# Patient Record
Sex: Female | Born: 1985 | Race: White | Hispanic: Yes | Marital: Married | Smoking: Never smoker
Health system: Southern US, Community
[De-identification: ages and names within clinical notes are randomized; demographics above are authoritative.]

## PROBLEM LIST (undated history)

## (undated) DIAGNOSIS — O24419 Gestational diabetes mellitus in pregnancy, unspecified control: Secondary | ICD-10-CM

## (undated) DIAGNOSIS — Z789 Other specified health status: Secondary | ICD-10-CM

## (undated) DIAGNOSIS — Z803 Family history of malignant neoplasm of breast: Secondary | ICD-10-CM

## (undated) DIAGNOSIS — Z8481 Family history of carrier of genetic disease: Secondary | ICD-10-CM

## (undated) DIAGNOSIS — Z86711 Personal history of pulmonary embolism: Secondary | ICD-10-CM

## (undated) HISTORY — DX: Personal history of pulmonary embolism: Z86.711

## (undated) HISTORY — PX: NO PAST SURGERIES: SHX2092

## (undated) HISTORY — DX: Family history of malignant neoplasm of breast: Z80.3

## (undated) HISTORY — DX: Family history of carrier of genetic disease: Z84.81

---
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
## 2007-06-29 ENCOUNTER — Other Ambulatory Visit: Admission: RE | Admit: 2007-06-29 | Discharge: 2007-06-29 | Payer: Self-pay | Admitting: Obstetrics and Gynecology

## 2007-12-08 ENCOUNTER — Inpatient Hospital Stay (HOSPITAL_COMMUNITY): Admission: AD | Admit: 2007-12-08 | Discharge: 2007-12-08 | Payer: Self-pay | Admitting: Obstetrics and Gynecology

## 2008-10-31 ENCOUNTER — Emergency Department (HOSPITAL_COMMUNITY): Admission: EM | Admit: 2008-10-31 | Discharge: 2008-10-31 | Payer: Self-pay | Admitting: Emergency Medicine

## 2008-12-27 ENCOUNTER — Emergency Department (HOSPITAL_COMMUNITY): Admission: EM | Admit: 2008-12-27 | Discharge: 2008-12-27 | Payer: Self-pay | Admitting: Emergency Medicine

## 2010-10-19 ENCOUNTER — Encounter: Payer: Self-pay | Admitting: *Deleted

## 2011-01-07 LAB — COMPREHENSIVE METABOLIC PANEL
Alkaline Phosphatase: 77 U/L (ref 39–117)
CO2: 25 mEq/L (ref 19–32)
Calcium: 9.8 mg/dL (ref 8.4–10.5)
Chloride: 105 mEq/L (ref 96–112)
Creatinine, Ser: 0.65 mg/dL (ref 0.4–1.2)
GFR calc Af Amer: >60 mL/min (ref >60)
Potassium: 3.6 mEq/L (ref 3.5–5.1)
Sodium: 138 mEq/L (ref 135–145)

## 2011-01-07 LAB — CBC
HCT: 40.2 % (ref 36.0–46.0)
Hemoglobin: 13.9 g/dL (ref 12.0–15.0)
RBC: 4.45 MIL/uL (ref 3.87–5.11)
RDW: 12.9 % (ref 11.5–15.5)

## 2011-01-07 LAB — DIFFERENTIAL
Basophils Relative: 0 % (ref 0–1)
Eosinophils Absolute: 0 10*3/uL (ref 0.0–0.7)
Lymphocytes Relative: 27 % (ref 12–46)
Monocytes Absolute: 0.5 10*3/uL (ref 0.1–1.0)
Neutro Abs: 5.9 10*3/uL (ref 1.7–7.7)

## 2011-01-07 LAB — URINALYSIS, ROUTINE W REFLEX MICROSCOPIC
Bilirubin Urine: NEGATIVE
Ketones, ur: NEGATIVE mg/dL
pH: 7 (ref 5.0–8.0)

## 2011-02-13 NOTE — H&P (Signed)
[name], SANSON                   ACCOUNT NO.:  [id number]   MEDICAL RECORD NO.:  [id number]           PATIENT TYPE:   LOCATION:                                 FACILITY:   PHYSICIAN:  Langley Gauss, MD          DATE OF BIRTH:   DATE OF ADMISSION:  07/23/2004  DATE OF DISCHARGE:  LH                                HISTORY & PHYSICAL   HISTORY:  A 25 year old gravida 1, para 0, at 88 weeks' gestation admitted  for induction of labor secondary to psychosocial factors.  The patient is  dependent upon others for transportation purposes.  Today she comes in with  her sister, Eloise __________. The patient has had irregular uterine  contractions over the weekend as well as increased mucous discharge.  She  has had cervical change over the prior week from 2-3 cm dilatation. The  patient's prenatal course had been uncomplicated.  GTT was normal at 118.  She has had serial ultrasounds which have documented adequate fetal growth  and normal anatomic survey.  She has experienced a 39 pound weight gain.  GBS noted to be negative.  A triple screen is normal.  She is noted to be  experiencing an abnormal Pap which did reveal the presence of a CUS.  No  biopsy was done during the pregnancy.   ALLERGIES:  She has no known drug allergies.   PAST MEDICAL HISTORY:  No other medical or surgical history.  This is her  first pregnancy.   SOCIAL HISTORY:  The patient previously attended [employer].  She is a nonsmoker. She does plan on utilizing condoms for postpartum birth  control purposes.  She plans on both bottle and breast feeding.   PHYSICAL EXAMINATION:  GENERAL:  A pleasant female who speaks English very  well.  VITAL SIGNS:  Height 5 feet 2 inches.  Weight prepregnancy 144 pounds,  today's weight 183.  Blood pressure 117/73, pulse rate of 80, respiratory  rate is 20.  HEENT:  Negative.  NECK:  No adenopathy.  Neck is supple.  Thyroid is nonpalpable.  LUNGS:  Clear.  CARDIOVASCULAR:  Regular rate and rhythm.  ABDOMEN:  Soft and nontender.  No surgical scars are identified. She is  vertex presentation by Leopold's maneuver.  EXTREMITIES:  Noted to be normal, 1+ edema.  PELVIC EXAM:  Normal external genitalia.  No lesions or ulcerations are  identified.  Pelvis is noted to be clinically adequate on examination.  Cervix 3 cm dilated, 70% effaced, vertex well applied at a 0 station.  Fetal  heart tones are auscultated in the 150s.  By patient history experiencing  irregular uterine contractions on today's date.   ASSESSMENT AND PLAN:  A 39-week intrauterine pregnancy in early stages of  labor.  Has had cervical change from 2-3 cm from the prior week.  Cervix is  very favorable at this time.  Plan on referring the patient to [facility]. Will proceed with amniotomy; thereafter augment or induce with  Pitocin as clinically indicated.  Dona   DC/MEDQ  D:  07/23/2004  T:  07/23/2004  Job:  [id number]   cc:   [facility]

## 2011-02-13 NOTE — Op Note (Signed)
NAMECORYNN, [name]                  ACCOUNT NO.:  [id number]   MEDICAL RECORD NO.:  [id number]          PATIENT TYPE:  INP   LOCATION:  A413                          FACILITY:  [facility]   PHYSICIAN:  Langley Gauss, MD     DATE OF BIRTH:  31-Oct-1985   DATE OF PROCEDURE:  07/24/2004  DATE OF DISCHARGE:                                  PROCEDURE NOTE   DELIVERY NOTE:   PROCEDURE:  1.  Thirty-nine-week intrauterine pregnancy, in early labor, delivery      performed, spontaneous assisted vaginal delivery of 7-pound 4-ounce      female infant.  2.  Midline episiotomy and repair.   ATTENDING:  Langley Gauss, M.D.   ANALGESIA:  The patient received IV Nubain during the course of labor; she  declined placement of an epidural.  At time of delivery, a total of 30 mL of  1% lidocaine plain were injected in the midline of the perineal body.   ESTIMATED BLOOD LOSS:  Less than  500 mL.   SPECIMENS:  Arterial cord gas and cord blood to pathology laboratory.  Placenta was noted and noted to be apparently intact with a 3-vessel  umbilical cord.   FINDINGS:  Findings at time of delivery include a nuchal cord x2 without  compression.   SUMMARY:  The patient was seen in the office, dated July 23, 2004, noted  to be in early labor with cervical change from 2- to 3-cm dilated, referred  to [facility] for direct admission.  GBS status is negative.  The  patient was noted to be contracting every 3-5 minutes.  Fetal scalp  electrode was placed with resultant amniotomy, findings of clear amniotic  fluid.  Subsequently, the patient did require Pitocin augmentation; she  declined placement of an epidural.  She progressed normally along the labor  curve to complete dilatation, had a strong urge to push, placed in the  dorsal lithotomy position, prepped and draped in the usual sterile manner,  30 mL of 1% lidocaine plain injected in the midline of the perineal body,  small midline episiotomy is  performed, infant delivered in a direct OA  position over this midline episiotomy without extension, mouth and nares  both suctioned of clear amniotic fluid.  A nuchal cord x2 was reduced.  Spontaneous rotation occurred to a right anterior shoulder position.  Gentle  downward traction combined with expulsive efforts resulted in delivery of  this anterior shoulder as well as the remainder of the infant without  difficulty.  Umbilical cord is then milked towards the infant, cord is  doubly clamped and cut, and infant is placed on the maternal abdomen for  immediate bonding purposes.  Arterial cord gas and cord blood are then  obtained.  Gentle traction on the umbilical cord results in separation,  which upon examination is noted to be an intact placenta with associated 3-  vessel umbilical cord.  Excellent uterine tone is achieved following  delivery.  Examination reveals no lacerations.  Midline episiotomy is  repaired utilizing interrupted 0 Vicryl sutures on the superficial  transverse perineal muscle, followed by interrupted 2-0 Vicryl suture on the  soft tissue layer beneath the subcutaneous fat.  The episiotomy is then  repaired utilizing 0 chromic in a running-locked fashion on the vaginal  mucosa, followed by a 2-layer closure of 0 chromic on the perineal body.  The patient tolerated the delivery very well.  She does plan on bottle-  feeding.  She is taken out of the dorsal lithotomy position and allowed to  bond with the infant.     Vira Blanco   DC/MEDQ  D:  07/24/2004  T:  07/24/2004  Job:  [id number]

## 2011-02-13 NOTE — Op Note (Signed)
NAMEZAKAIYA, LARES                  ACCOUNT NO.:  [id number]   MEDICAL RECORD NO.:  [id number]          PATIENT TYPE:  INP   LOCATION:  LDR2                          FACILITY:  [facility]   PHYSICIAN:  Langley Gauss, MD     DATE OF BIRTH:  11-Jan-1986   DATE OF PROCEDURE:  DATE OF DISCHARGE:                                 OPERATIVE REPORT   Patient is admitted in the early stages of labor.  External fetal monitor  reveals reassuring fetal heart rate.  She is noted to have mild uterine  contractions every 3-5 minutes.   On examination, she is noted to be 3 cm dilated, 80% effaced, 0 station.  Vertex is well applied to the cervix.  Fetal scalp electrode is placed with  resultant amniotomy.  Chorioamnionic fluid is noted.   ASSESSMENT/PLAN:  Will proceed with Pitocin augmentation at this time to  increase the frequency and intensity of uterine contractions.  Patient  subsequently does desire placement of epidural for labor analgesia.     Vira Blanco   DC/MEDQ  D:  07/23/2004  T:  07/23/2004  Job:  [id number]

## 2011-02-13 NOTE — Discharge Summary (Signed)
NAMESUNI, JARNAGIN                  ACCOUNT NO.:  [id number]   MEDICAL RECORD NO.:  [id number]          PATIENT TYPE:  INP   LOCATION:  A413                          FACILITY:  [facility]   PHYSICIAN:  Langley Gauss, MD     DATE OF BIRTH:  November 15, 1985   DATE OF ADMISSION:  07/23/2004  DATE OF DISCHARGE:  10/29/2005LH                                 DISCHARGE SUMMARY   PROCEDURES:  Vaginal delivery on July 24, 2004.   DISPOSITION:  The patient is breast-feeding at the time of discharge.  Follow up in the office in four weeks' time.  She is given a copy of  standard discharge instructions.   DISCHARGE MEDICATIONS:  Tylox without refills for pain relief.   LABORATORY DATA:  Admission hemoglobin and hematocrit 11.4 and 34.5, white  count of 8.1.  On postpartum day #1, hemoglobin was 10.3 and hematocrit of  31.1, with a white count of 13.5.   HOSPITAL COURSE:  See previous dictations.  The patient delivered without  complications on July 24, 2004.  Viable vigorous female infant.  Postpartum did well.  Bonding well with the infant.  Had no complaints  following delivery.  Had no postpartum complications.  Was discharged home  on postpartum day #1-1/2.     Vira Blanco   DC/MEDQ  D:  07/29/2004  T:  07/29/2004  Job:  [id number]

## 2013-01-05 ENCOUNTER — Encounter (HOSPITAL_COMMUNITY): Payer: Self-pay

## 2013-01-05 ENCOUNTER — Emergency Department (HOSPITAL_COMMUNITY)
Admission: EM | Admit: 2013-01-05 | Discharge: 2013-01-05 | Disposition: A | Discharge status: Home / Self Care | Payer: Self-pay | Attending: Emergency Medicine | Admitting: Emergency Medicine

## 2013-01-05 DIAGNOSIS — B349 Viral infection, unspecified: Secondary | ICD-10-CM

## 2013-01-05 DIAGNOSIS — R51 Headache: Secondary | ICD-10-CM | POA: Insufficient documentation

## 2013-01-05 DIAGNOSIS — M545 Low back pain, unspecified: Secondary | ICD-10-CM | POA: Insufficient documentation

## 2013-01-05 DIAGNOSIS — R509 Fever, unspecified: Secondary | ICD-10-CM | POA: Insufficient documentation

## 2013-01-05 DIAGNOSIS — B9789 Other viral agents as the cause of diseases classified elsewhere: Secondary | ICD-10-CM | POA: Insufficient documentation

## 2013-01-05 DIAGNOSIS — R61 Generalized hyperhidrosis: Secondary | ICD-10-CM | POA: Insufficient documentation

## 2013-01-05 DIAGNOSIS — R110 Nausea: Secondary | ICD-10-CM | POA: Insufficient documentation

## 2013-01-05 LAB — URINE MICROSCOPIC-ADD ON

## 2013-01-05 LAB — PREGNANCY, URINE: Preg Test, Ur: NEGATIVE

## 2013-01-05 LAB — URINALYSIS, ROUTINE W REFLEX MICROSCOPIC
Ketones, ur: NEGATIVE mg/dL
Leukocytes, UA: NEGATIVE
Specific Gravity, Urine: 1.025 (ref 1.005–1.030)

## 2013-01-05 LAB — RAPID STREP SCREEN (MED CTR MEBANE ONLY): Streptococcus, Group A Screen (Direct): NEGATIVE

## 2013-01-05 MED ORDER — SODIUM CHLORIDE 0.9 % IV SOLN
1000.0000 mL | INTRAVENOUS | Status: DC
  Administered 2013-01-05 (×2): 1000 mL via INTRAVENOUS

## 2013-01-05 MED ORDER — DEXAMETHASONE SODIUM PHOSPHATE 10 MG/ML IJ SOLN
10.0000 mg | Freq: Once | INTRAMUSCULAR | Status: AC
  Administered 2013-01-05: 10 mg via INTRAVENOUS
  Filled 2013-01-05: qty 1

## 2013-01-05 MED ORDER — METOCLOPRAMIDE HCL 10 MG PO TABS: 10.0000 mg | ORAL_TABLET | Freq: Four times a day (QID) | ORAL | Status: DC | PRN

## 2013-01-05 MED ORDER — SODIUM CHLORIDE 0.9 % IV SOLN
1000.0000 mL | Freq: Once | INTRAVENOUS | Status: AC
  Administered 2013-01-05: 1000 mL via INTRAVENOUS

## 2013-01-05 MED ORDER — DIPHENHYDRAMINE HCL 50 MG/ML IJ SOLN
25.0000 mg | Freq: Once | INTRAMUSCULAR | Status: AC
  Administered 2013-01-05: 25 mg via INTRAVENOUS
  Filled 2013-01-05: qty 1

## 2013-01-05 MED ORDER — KETOROLAC TROMETHAMINE 30 MG/ML IJ SOLN
30.0000 mg | Freq: Once | INTRAMUSCULAR | Status: AC
  Administered 2013-01-05: 30 mg via INTRAVENOUS
  Filled 2013-01-05: qty 1

## 2013-01-05 MED ORDER — METOCLOPRAMIDE HCL 5 MG/ML IJ SOLN
10.0000 mg | Freq: Once | INTRAMUSCULAR | Status: AC
  Administered 2013-01-05: 10 mg via INTRAVENOUS
  Filled 2013-01-05: qty 2

## 2013-01-05 NOTE — ED Provider Notes (Signed)
History    This chart was scribed for Madison Booze, MD by Sofie Rower, ED Scribe. The patient was seen in room APA08/APA08 and the patient's care was started at 1:16PM.    CSN: [id number]  Arrival date & time 01/05/13  1158   First MD Initiated Contact with Patient 01/05/13 1316      Chief Complaint  Patient presents with  . Back Pain  . Fever  . Chills    (Consider location/radiation/quality/duration/timing/severity/associated sxs/prior treatment) The history is provided by the patient. No language interpreter was used.    KRISTENA WILHELMI is a 27 y.o. female , with no known medical hx, who presents to the Emergency Department complaining of sudden, progressively improving, back pain located at the thoracic and lumbar spine, radiating downwards towards the bilateral lower extremities, onset yesterday (01/04/13).  Associated symptoms include nausea. The pt reports she is experiencing a throbbing back pain, as if she may be in labor. Furthermore, the pt rates her back pain at a 10/10 at worst and 4/10 at present. The pt has has taken ibuprofen which provides moderate relief of the back pain. Additionally, the pt presents to the ED complaining of sudden, progressively worsening, non radiating headache, located at the occipital region of the head, onset yesterday (01/04/09). Associated symptoms include fever (subjective), chills, sweats, and sinus pressure. The pt reports she is experiencing a throbbing, painful sensation, located at the occipital region of her head, intensified by exposure to loud sounds.  Importantly, the pt reveals she has experienced sick contacts (her mother with similar symptoms).   The pt does not smoke or drink alcohol.   Pt does not have a PCP.     History reviewed. No pertinent past medical history.  History reviewed. No pertinent past surgical history.  No family history on file.  History  Substance Use Topics  . Smoking status: Never Smoker   . Smokeless  tobacco: Not on file  . Alcohol Use: No    OB History   Grav Para Term Preterm Abortions TAB SAB Ect Mult Living                  Review of Systems  Constitutional: Positive for fever, chills and diaphoresis.  Gastrointestinal: Positive for nausea. Negative for vomiting, diarrhea and constipation.  Genitourinary: Negative for dysuria, frequency, vaginal bleeding, vaginal discharge and difficulty urinating.  Musculoskeletal: Positive for back pain.  Neurological: Positive for headaches.  All other systems reviewed and are negative.    Allergies  Review of patient's allergies indicates no known allergies.  Home Medications  No current outpatient prescriptions on file.  BP 106/56  Pulse 96  Temp(Src) 98.4 F (36.9 C) (Oral)  Resp 20  Ht 5\' 3"  (1.6 m)  Wt 175 lb (79.379 kg)  BMI 31.01 kg/m2  SpO2 99%  Physical Exam  Nursing note and vitals reviewed. Constitutional: She is oriented to person, place, and time. She appears well-developed and well-nourished. No distress.  HENT:  Head: Normocephalic and atraumatic.  Mouth/Throat: Oropharynx is clear and moist.  Eyes: Conjunctivae and EOM are normal. Pupils are equal, round, and reactive to light.  Fundi normal.   Neck: Neck supple. No tracheal deviation present.  Cardiovascular: Normal rate, regular rhythm and normal heart sounds.  Exam reveals no gallop and no friction rub.   No murmur heard. Pulmonary/Chest: Effort normal and breath sounds normal. No respiratory distress. She has no wheezes.  Abdominal: Soft. She exhibits no distension. There is no tenderness.  Musculoskeletal: Normal range of motion. She exhibits no edema.  Negative straight leg raise.   Neurological: She is alert and oriented to person, place, and time. No cranial nerve deficit or sensory deficit.  Skin: Skin is warm and dry.  Psychiatric: She has a normal mood and affect. Her behavior is normal.    ED Course  Procedures (including critical care  time)  DIAGNOSTIC STUDIES: Oxygen Saturation is 99% on room air, normal by my interpretation.    COORDINATION OF CARE:   1:36 PM- Treatment plan discussed with patient. Pt agrees with treatment.  3:22 PM- Recheck. Pt is feeling better at this time. Treatment plan discussed with patient. Pt agrees with treatment.        Results for orders placed during the hospital encounter of 01/05/13  URINALYSIS, ROUTINE W REFLEX MICROSCOPIC      Result Value Range   Color, Urine YELLOW  YELLOW   APPearance CLEAR  CLEAR   Specific Gravity, Urine 1.025  1.005 - 1.030   pH 7.0  5.0 - 8.0   Glucose, UA NEGATIVE  NEGATIVE mg/dL   Hgb urine dipstick MODERATE (*) NEGATIVE   Bilirubin Urine NEGATIVE  NEGATIVE   Ketones, ur NEGATIVE  NEGATIVE mg/dL   Protein, ur TRACE (*) NEGATIVE mg/dL   Urobilinogen, UA 0.2  0.0 - 1.0 mg/dL   Nitrite NEGATIVE  NEGATIVE   Leukocytes, UA NEGATIVE  NEGATIVE  PREGNANCY, URINE      Result Value Range   Preg Test, Ur NEGATIVE  NEGATIVE  URINE MICROSCOPIC-ADD ON      Result Value Range   Squamous Epithelial / LPF MANY (*) RARE   WBC, UA 0-2  <3 WBC/hpf   RBC / HPF 0-2  <3 RBC/hpf   Bacteria, UA MANY (*) RARE      1. Viral syndrome   2. Headache   3. Low back pain       MDM  Symptom complex most compatible with viral syndrome. Headache may be part of that it may be a migraine. She'll be given IV fluids, IV metoclopramide, IV diphenhydramine, IV ketorolac, and IV dexamethasone. Urinalysis does have many bacteria but also as many epithelial cells and she does not have symptoms suggestive of urinary tract infection. It is felt that her bacteria is related to contaminated specimen and therefore she has not started on antibiotics.  She feels significantly better after the above noted treatment. She is advised to use over-the-counter NSAIDs and is given a prescription for metoclopramide.      I personally performed the services described in this  documentation, which was scribed in my presence. The recorded information has been reviewed and is accurate.   Madison Booze, MD 01/05/13 [id number]

## 2013-01-05 NOTE — ED Notes (Signed)
Pt presents with bilateral flank pain and mid back pain x 2 days. Pt reports started as nausea, chills and "feeling hot". Has history of kidney stones. Denies urinary dysuria,hematuria and frequency, emesis and diarrhea. Pt reports one sexual partner with no vaginal discomfort or discharge. NAD noted at this time.

## 2013-01-05 NOTE — ED Notes (Signed)
Pt reports fever, chills and low back pain that started yesterday, denies any painful urination or vaginal discharge.  +nausea yesterday.

## 2014-01-11 ENCOUNTER — Ambulatory Visit (INDEPENDENT_AMBULATORY_CARE_PROVIDER_SITE_OTHER): Payer: 59 | Admitting: Obstetrics and Gynecology

## 2014-01-11 ENCOUNTER — Encounter: Payer: Self-pay | Admitting: Obstetrics and Gynecology

## 2014-01-11 VITALS — BP 108/68 | Ht 63.0 in | Wt 184.4 lb

## 2014-01-11 DIAGNOSIS — Z309 Encounter for contraceptive management, unspecified: Secondary | ICD-10-CM | POA: Insufficient documentation

## 2014-01-11 NOTE — Progress Notes (Signed)
This chart was scribed by Leone PayorSonum Patel, Medical Scribe, for Dr. Christin BachJohn Binyamin Nelis on 01/11/14 at 3:22 PM. This chart was reviewed by Dr. Christin BachJohn Temekia Caskey and is accurate.    Patient given informed consent for removal of her Implanon, time out was performed.  Signed copy in the chart.  Appropriate time out taken. Unable to confirm location of the implant. Patient will be rescheduled to another date so that procedure can be done under ultrasound guidance.    IMP unable to find implanon. P u/s guided removal soon

## 2014-01-12 ENCOUNTER — Telehealth: Payer: Self-pay | Admitting: Obstetrics and Gynecology

## 2014-01-16 NOTE — Telephone Encounter (Signed)
Pt has an US scheduled at our office. JSY

## 2014-01-17 ENCOUNTER — Other Ambulatory Visit: Payer: 59

## 2014-01-17 ENCOUNTER — Ambulatory Visit (INDEPENDENT_AMBULATORY_CARE_PROVIDER_SITE_OTHER): Payer: 59 | Admitting: Obstetrics and Gynecology

## 2014-01-17 ENCOUNTER — Ambulatory Visit (HOSPITAL_COMMUNITY)
Admission: RE | Admit: 2014-01-17 | Discharge: 2014-01-17 | Disposition: A | Discharge status: Home / Self Care | Payer: 59 | Source: Ambulatory Visit | Attending: Obstetrics and Gynecology | Admitting: Obstetrics and Gynecology

## 2014-01-17 DIAGNOSIS — Z3049 Encounter for surveillance of other contraceptives: Secondary | ICD-10-CM

## 2014-01-17 DIAGNOSIS — Z975 Presence of (intrauterine) contraceptive device: Secondary | ICD-10-CM

## 2014-01-17 DIAGNOSIS — Z309 Encounter for contraceptive management, unspecified: Secondary | ICD-10-CM

## 2014-01-17 NOTE — Progress Notes (Addendum)
This chart was scribed by Bennett Scrapehristina Taylor, Medical Scribe, for Dr. Christin BachJohn Leeba Barbe on 01/17/14 at 3:00 PM. This chart was reviewed by Dr. Christin BachJohn Amarion Portell and is accurate.   [facility] Visit  Patient name: [name] MRN [id number]  Date of birth: 02/21/1986  CC & HPI:  Madison EstelleSara L Mulroy is a 28 y.o. female presenting today for nexaplanon removal. Has plans for birth control patch but wants to wait to get family planning first due to lack of insurance currently.   ROS:  No complaints  Pertinent History Reviewed:  Medical & Surgical Hx:  Reviewed: Significant for no chronic medical conditions Medications: Reviewed & Updated - see associated section Social History: Reviewed -  reports that she has never smoked. She has never used smokeless tobacco.  Objective Findings:  Vitals: There were no vitals taken for this visit.  Physical Examination: attempted to palpate Nexplanon but was unsuccessful. U/S was unable to identify.   Assessment & Plan:  A: 1. Nexaplanon in right arm, unable to identify by palpation or U/S  P: 1. DG right humerus ordered 2. Will review xray report by phone 3. F/u PRN for family planning  01/27/14, the implant is an IMPLANON, not nexplanon, so it will NOT show up on xray. Pt will need high resolution u/s , and if not identified, may need MRI. jvf

## 2014-01-18 NOTE — Telephone Encounter (Signed)
Xray does NOT identify an implant in arm. Pt has had implant over 3 yrs and had a change in menses after implant placed, so location of IMplant likely in arm. Reviewed option:[facility], and if unsuccessful, may require MRI to ID location.

## 2014-01-19 ENCOUNTER — Telehealth: Payer: Self-pay | Admitting: Obstetrics and Gynecology

## 2014-01-19 DIAGNOSIS — Z3046 Encounter for surveillance of implantable subdermal contraceptive: Secondary | ICD-10-CM

## 2014-01-19 NOTE — Telephone Encounter (Signed)
Pt states was to call Dr. Emelda FearFerguson and give lot # for implanon   LOT # [id number] [facility]

## 2014-01-25 ENCOUNTER — Telehealth: Payer: Self-pay | Admitting: *Deleted

## 2014-01-25 NOTE — Telephone Encounter (Signed)
Pt called stating has left messages several times for Dr. Emelda FearFerguson. Pt informed Dr. Emelda FearFerguson out of office back tomorrow.

## 2014-01-27 ENCOUNTER — Other Ambulatory Visit: Payer: Self-pay | Admitting: Obstetrics and Gynecology

## 2014-01-27 DIAGNOSIS — Z975 Presence of (intrauterine) contraceptive device: Secondary | ICD-10-CM

## 2014-01-27 NOTE — Telephone Encounter (Signed)
Pt contacted by phone.  Implanon online info reviewed, pt will need a High Resolution u/s of arm, to attempt to ID the site of the implanon, which pt states she has never been able to feel since implantation.  If unable to be located with u/s high resolution, will need MRI, and have marker in place to locate for removal.  [facility]

## 2014-01-29 ENCOUNTER — Other Ambulatory Visit: Payer: Self-pay | Admitting: Obstetrics and Gynecology

## 2014-02-02 ENCOUNTER — Other Ambulatory Visit: Payer: Self-pay | Admitting: Obstetrics and Gynecology

## 2014-02-02 ENCOUNTER — Telehealth: Payer: Self-pay | Admitting: *Deleted

## 2014-02-02 NOTE — Addendum Note (Signed)
Addended by: Criss AlvinePULLIAM, Esteen Delpriore G on: 02/02/2014 12:18 PM   Modules accepted: Orders

## 2014-02-02 NOTE — Telephone Encounter (Signed)
Pt calling to f/u with Dr. Emelda FearFerguson about [facility]

## 2014-02-02 NOTE — Telephone Encounter (Signed)
[facility],2015 at 10:45 am.

## 2014-02-05 NOTE — Telephone Encounter (Signed)
U/S scheduled pt aware of date and time.

## 2014-02-06 ENCOUNTER — Ambulatory Visit (HOSPITAL_COMMUNITY): Payer: Self-pay

## 2014-02-08 ENCOUNTER — Ambulatory Visit (HOSPITAL_COMMUNITY)
Admission: RE | Admit: 2014-02-08 | Discharge: 2014-02-08 | Disposition: A | Discharge status: Home / Self Care | Payer: 59 | Source: Ambulatory Visit | Attending: Obstetrics and Gynecology | Admitting: Obstetrics and Gynecology

## 2014-02-08 ENCOUNTER — Ambulatory Visit (INDEPENDENT_AMBULATORY_CARE_PROVIDER_SITE_OTHER): Payer: 59 | Admitting: Obstetrics and Gynecology

## 2014-02-08 ENCOUNTER — Encounter: Payer: Self-pay | Admitting: Obstetrics and Gynecology

## 2014-02-08 VITALS — BP 110/70 | Ht 63.0 in | Wt 182.0 lb

## 2014-02-08 DIAGNOSIS — Z3046 Encounter for surveillance of implantable subdermal contraceptive: Secondary | ICD-10-CM

## 2014-02-08 DIAGNOSIS — Z975 Presence of (intrauterine) contraceptive device: Secondary | ICD-10-CM | POA: Insufficient documentation

## 2014-02-08 MED ORDER — CEPHALEXIN 500 MG PO CAPS: 500.0000 mg | ORAL_CAPSULE | Freq: Four times a day (QID) | ORAL | Status: DC

## 2014-02-08 NOTE — Progress Notes (Signed)
This chart was scribed by [name], Medical Scribe, for Dr. Christin BachJohn FergusonBennett Dillon on 02/08/14 at 3:22 PM. This chart was reviewed by [name] for accuracy.   GYNECOLOGY CLINIC PROCEDURE NOTE  Madison EstelleSara L Dillon is a 28 y.o. [phone] here for Nexplanon removal. No GYN concerns. No other gynecologic concerns.  Implanon Removal Patient was given informed consent for removal of her Implanon.  Appropriate time out taken. Implanon site identified using U/S at [facility]. U/S impression below. IMPRESSION:  Linear subcutaneous hyperechoic shadowing focus extending over at  least 3 cm length likely representing the contraceptive implant in  the upper RIGHT arm.  Area prepped in usual sterile fashon. One ml of 1% lidocaine was used to anesthetize the area at the distal end of the implant. A small stab incision was made right beside the implant on the distal portion.  The Implanon rod was not able grasped or removed despite opening incision to a length of 1 cm and probing to a depth of 8 mm into the right arm.  There was minimal blood loss. There were no complications. Incision was sutured with 4-0 prolene, 2 interrupted. A pressure bandage was applied to reduce any bruising.  The patient tolerated the procedure well and will be given a referral for further f/u.  Patient is planning to use birth control patch for contraception once Implanon is removed.  P: 1. Keflex 500 mg QID x4 tablets 2. F/u in 6 days for suture removal 3. Pt addressed on concerning symptoms to return for 4. Will investigate Implanon company for further f/u  Madison BurrowJohn V. Staley Dillon  4:00 PM

## 2014-02-08 NOTE — Addendum Note (Signed)
Addended by: Tilda BurrowFERGUSON, Liala Codispoti V on: 02/08/2014 04:19 PM   Modules accepted: Level of Service

## 2014-02-14 ENCOUNTER — Encounter: Payer: Self-pay | Admitting: Obstetrics and Gynecology

## 2014-02-14 ENCOUNTER — Ambulatory Visit (INDEPENDENT_AMBULATORY_CARE_PROVIDER_SITE_OTHER): Payer: 59 | Admitting: Obstetrics and Gynecology

## 2014-02-14 VITALS — BP 118/76 | Ht 63.0 in | Wt 182.0 lb

## 2014-02-14 DIAGNOSIS — Z4802 Encounter for removal of sutures: Secondary | ICD-10-CM

## 2014-02-14 NOTE — Progress Notes (Signed)
Suture removal:  Right arm inspected. Pt has a tape burn on outer rt arm, but otherwise fine. Pt is healing well.  Stitches removed oo probs P will try to identify provider that can do u/s guided exploration.

## 2014-03-01 ENCOUNTER — Telehealth: Payer: Self-pay | Admitting: *Deleted

## 2014-03-01 NOTE — Telephone Encounter (Signed)
Pt states that she was following up with Dr. Emelda Fear on the Implanon that he was supposed to remove. Pt states that Dr. Emelda Fear said he would contact the company to see if there is a specialist he can send the pt to to have the implanon removed. The pt was advised that I would speak with Dr. Emelda Fear and that he would call her back and discuss what he found out for her.

## 2014-03-13 ENCOUNTER — Telehealth: Payer: Self-pay | Admitting: Obstetrics and Gynecology

## 2014-03-13 NOTE — Telephone Encounter (Signed)
Pt was advised of Dr. Rayna SextonFerguson's advice, and stated that she had planned on just applying for assistance through the hospital. The pt states that she is certain that she wants this thing out and is willing to pay and make arrangements to pay for the procedure to be done. Pt states that she has applied for [employer] insurance and that it was going to cost too much. Pt states that she wants to discuss everything with her husband and go from there. Pt is going to look into some other things and insurance options and call us back when she has made her decision.

## 2014-03-13 NOTE — Telephone Encounter (Signed)
Pt states that she is still waiting to hear from Dr. Emelda FearFerguson about getting Nexplanon removed. Pt states that she has been waiting for a few weeks. Pt wants to know if she should just have surgery to remove it, she is just ready to get it out of her arm. i advised the pt that Dr.Feguson is out of the office this morning and that as soon as he comes in I will talk to him about it. Pt verbalized understanding.

## 2014-03-13 NOTE — Telephone Encounter (Signed)
I spoke with Dr. Emelda FearFerguson and he advised that the pt should try and get insurance and then get this taken care of. He also advised that the medication was a safe medication and that it should not cause her any issues.

## 2014-04-03 ENCOUNTER — Telehealth: Payer: Self-pay | Admitting: Obstetrics and Gynecology

## 2014-04-17 NOTE — Telephone Encounter (Signed)
I finally spoke to the pt and she stated that she does still want to have the surgery done to remove the implanon. The pt stated that she has applied for insurance and will know in a couple weeks if she is approved. The pt was given an appointment for mid to late august for a pre- op with Dr. Emelda FearFerguson. The pt will call the office if anything changes with insurance etc.

## 2014-05-10 ENCOUNTER — Encounter: Payer: Self-pay | Admitting: Obstetrics and Gynecology

## 2014-05-10 ENCOUNTER — Ambulatory Visit (INDEPENDENT_AMBULATORY_CARE_PROVIDER_SITE_OTHER): Payer: 59 | Admitting: Obstetrics and Gynecology

## 2014-05-10 ENCOUNTER — Other Ambulatory Visit: Payer: Self-pay | Admitting: Obstetrics and Gynecology

## 2014-05-10 VITALS — BP 130/80 | Ht 63.0 in | Wt 183.0 lb

## 2014-05-10 DIAGNOSIS — Z308 Encounter for other contraceptive management: Secondary | ICD-10-CM

## 2014-05-10 DIAGNOSIS — M795 Residual foreign body in soft tissue: Secondary | ICD-10-CM

## 2014-05-10 DIAGNOSIS — Z975 Presence of (intrauterine) contraceptive device: Secondary | ICD-10-CM | POA: Insufficient documentation

## 2014-05-10 NOTE — Progress Notes (Signed)
Subjective:     Patient ID: [name], female   DOB: 10/18/1985, 28 y.o.   MRN: [id number]  HPI  the patient returns requesting scheduling of removal of Implanon from her right arm. She had efforts at removal in the office were unsuccessful even though she had the location of the Implanon mark and ultrasound in the office before presenting to our office for efforts at removal. She now desires being taken to the OR for removal. She has required insurance and desires to precede as soon as possible She will require ultrasound guidance in the operating room to achieve removal.  Review of Systems    she is still not having periods due to the implant Objective:   Physical Exam  Constitutional: She is oriented to person, place, and time. She appears well-developed and well-nourished.  Eyes: Pupils are equal, round, and reactive to light.  Neck: Normal range of motion. Neck supple.  Cardiovascular: Normal rate.   Pulmonary/Chest: Effort normal.  Abdominal: Soft.  Neurological: She is alert and oriented to person, place, and time.  Skin: Skin is warm and dry.  Right upper arm has a well-healed surgical scar where we attempted to remove the Implanon in the office. The Implanon cannot be felt on palpation  Psychiatric: She has a normal mood and affect. Her behavior is normal. Thought content normal.   the side of exploration for the Implanon was approximately 1 cm lateral to the site where the Implanon had been inserted     Assessment:     Retained Implanon, due to the deep placement Plan    Plan:     Ultrasound guidance removal In the operating room Patient referred to in regards for scheduling of surgery

## 2014-05-14 ENCOUNTER — Encounter: Payer: Self-pay | Admitting: Obstetrics and Gynecology

## 2014-05-23 ENCOUNTER — Encounter (HOSPITAL_COMMUNITY): Payer: Self-pay | Admitting: Pharmacy Technician

## 2014-05-29 ENCOUNTER — Other Ambulatory Visit: Payer: Self-pay | Admitting: Obstetrics and Gynecology

## 2014-06-01 ENCOUNTER — Encounter (HOSPITAL_COMMUNITY): Payer: Self-pay

## 2014-06-01 ENCOUNTER — Encounter (HOSPITAL_COMMUNITY)
Admission: RE | Admit: 2014-06-01 | Discharge: 2014-06-01 | Disposition: A | Discharge status: Home / Self Care | Payer: 59 | Source: Ambulatory Visit | Attending: Obstetrics and Gynecology | Admitting: Obstetrics and Gynecology

## 2014-06-01 DIAGNOSIS — T85698A Other mechanical complication of other specified internal prosthetic devices, implants and grafts, initial encounter: Secondary | ICD-10-CM | POA: Diagnosis present

## 2014-06-01 DIAGNOSIS — Z01818 Encounter for other preprocedural examination: Secondary | ICD-10-CM | POA: Diagnosis present

## 2014-06-01 DIAGNOSIS — Z975 Presence of (intrauterine) contraceptive device: Secondary | ICD-10-CM | POA: Insufficient documentation

## 2014-06-01 HISTORY — DX: Other specified health status: Z78.9

## 2014-06-01 LAB — CBC
HCT: 38.8 % (ref 36.0–46.0)
HEMOGLOBIN: 13.2 g/dL (ref 12.0–15.0)
MCH: 30.6 pg (ref 26.0–34.0)
MCHC: 34 g/dL (ref 30.0–36.0)
MCV: 90 fL (ref 78.0–100.0)
Platelets: 259 10*3/uL (ref 150–400)
RBC: 4.31 MIL/uL (ref 3.87–5.11)
RDW: 12.6 % (ref 11.5–15.5)
WBC: 6.1 10*3/uL (ref 4.0–10.5)

## 2014-06-01 LAB — URINALYSIS, ROUTINE W REFLEX MICROSCOPIC
BILIRUBIN URINE: NEGATIVE
GLUCOSE, UA: NEGATIVE mg/dL
KETONES UR: NEGATIVE mg/dL
NITRITE: NEGATIVE
PH: 7 (ref 5.0–8.0)
Protein, ur: NEGATIVE mg/dL
Specific Gravity, Urine: 1.02 (ref 1.005–1.030)
UROBILINOGEN UA: 0.2 mg/dL (ref 0.0–1.0)

## 2014-06-01 LAB — HCG, SERUM, QUALITATIVE: Preg, Serum: NEGATIVE

## 2014-06-01 LAB — URINE MICROSCOPIC-ADD ON

## 2014-06-01 LAB — BASIC METABOLIC PANEL
Anion gap: 11 (ref 5–15)
BUN: 10 mg/dL (ref 6–23)
CALCIUM: 9.4 mg/dL (ref 8.4–10.5)
CHLORIDE: 103 meq/L (ref 96–112)
CO2: 25 meq/L (ref 19–32)
Creatinine, Ser: 0.57 mg/dL (ref 0.50–1.10)
GFR calc Af Amer: >90 mL/min (ref >90)
GFR calc non Af Amer: >90 mL/min (ref >90)
Glucose, Bld: 91 mg/dL (ref 70–99)
Potassium: 4.3 mEq/L (ref 3.7–5.3)
SODIUM: 139 meq/L (ref 137–147)

## 2014-06-01 NOTE — Patient Instructions (Signed)
[name]  06/01/2014   Your procedure is scheduled on:  06/07/14  Report to [facility] at 8:30 AM.  Call this number if you have problems the morning of surgery: [phone]   Remember:   Do not eat food or drink liquids after midnight.   Take these medicines the morning of surgery with A SIP OF WATER: NONE   Do not wear jewelry, make-up or nail polish.  Do not wear lotions, powders, or perfumes. You may wear deodorant.  Do not shave 48 hours prior to surgery. Men may shave face and neck.  Do not bring valuables to the hospital.  [facility] is not responsible for any belongings or valuables.               Contacts, dentures or bridgework may not be worn into surgery.  Leave suitcase in the car. After surgery it may be brought to your room.  For patients admitted to the hospital, discharge time is determined by your treatment team.               Patients discharged the day of surgery will not be allowed to drive home.  Name and phone number of your driver:   Special Instructions: Shower using CHG 2 nights before surgery and the night before surgery.  If you shower the day of surgery use CHG.  Use special wash - you have one bottle of CHG for all showers.  You should use approximately 1/3 of the bottle for each shower.   Please read over the following fact sheets that you were given: Surgical Site Infection Prevention and Anesthesia Post-op Instructions   PATIENT INSTRUCTIONS POST-ANESTHESIA  IMMEDIATELY FOLLOWING SURGERY:  Do not drive or operate machinery for the first twenty four hours after surgery.  Do not make any important decisions for twenty four hours after surgery or while taking narcotic pain medications or sedatives.  If you develop intractable nausea and vomiting or a severe headache please notify your doctor immediately.  FOLLOW-UP:  Please make an appointment with your surgeon as instructed. You do not need to follow up with anesthesia unless specifically instructed  to do so.  WOUND CARE INSTRUCTIONS (if applicable):  Keep a dry clean dressing on the anesthesia/puncture wound site if there is drainage.  Once the wound has quit draining you may leave it open to air.  Generally you should leave the bandage intact for twenty four hours unless there is drainage.  If the epidural site drains for more than 36-48 hours please call the anesthesia department.  QUESTIONS?:  Please feel free to call your physician or the hospital operator if you have any questions, and they will be happy to assist you.

## 2014-06-07 ENCOUNTER — Ambulatory Visit (HOSPITAL_COMMUNITY)
Admission: RE | Admit: 2014-06-07 | Discharge: 2014-06-07 | Disposition: A | Discharge status: Home / Self Care | Payer: 59 | Source: Ambulatory Visit | Attending: Obstetrics and Gynecology | Admitting: Obstetrics and Gynecology

## 2014-06-07 ENCOUNTER — Encounter (HOSPITAL_COMMUNITY): Payer: Self-pay | Admitting: *Deleted

## 2014-06-07 ENCOUNTER — Encounter (HOSPITAL_COMMUNITY)
Admission: RE | Disposition: A | Discharge status: Home / Self Care | Payer: Self-pay | Source: Ambulatory Visit | Attending: Obstetrics and Gynecology

## 2014-06-07 ENCOUNTER — Ambulatory Visit (HOSPITAL_COMMUNITY): Payer: 59 | Admitting: Anesthesiology

## 2014-06-07 ENCOUNTER — Other Ambulatory Visit: Payer: Self-pay | Admitting: Obstetrics and Gynecology

## 2014-06-07 ENCOUNTER — Ambulatory Visit (HOSPITAL_COMMUNITY): Admission: RE | Admit: 2014-06-07 | Payer: 59 | Source: Ambulatory Visit

## 2014-06-07 ENCOUNTER — Ambulatory Visit (HOSPITAL_COMMUNITY): Payer: 59

## 2014-06-07 ENCOUNTER — Encounter (HOSPITAL_COMMUNITY): Payer: 59 | Admitting: Anesthesiology

## 2014-06-07 DIAGNOSIS — Z4689 Encounter for fitting and adjustment of other specified devices: Secondary | ICD-10-CM | POA: Insufficient documentation

## 2014-06-07 DIAGNOSIS — Z3046 Encounter for surveillance of implantable subdermal contraceptive: Secondary | ICD-10-CM | POA: Insufficient documentation

## 2014-06-07 DIAGNOSIS — M795 Residual foreign body in soft tissue: Secondary | ICD-10-CM

## 2014-06-07 HISTORY — PX: REMOVAL OF NON VAGINAL CONTRACEPTIVE DEVICE: SHX6219

## 2014-06-07 SURGERY — REMOVAL, CONTRACEPTIVE DEVICE, NON-VAGINAL
Anesthesia: General | Site: Arm Upper | Laterality: Right

## 2014-06-07 MED ORDER — MIDAZOLAM HCL 2 MG/2ML IJ SOLN
1.0000 mg | INTRAMUSCULAR | Status: DC | PRN
  Administered 2014-06-07: 2 mg via INTRAVENOUS

## 2014-06-07 MED ORDER — PROPOFOL 10 MG/ML IV BOLUS
INTRAVENOUS | Status: DC | PRN
  Administered 2014-06-07: 140 mg via INTRAVENOUS

## 2014-06-07 MED ORDER — MIDAZOLAM HCL 2 MG/2ML IJ SOLN
INTRAMUSCULAR | Status: AC
  Filled 2014-06-07: qty 2

## 2014-06-07 MED ORDER — ONDANSETRON HCL 4 MG/2ML IJ SOLN
4.0000 mg | Freq: Once | INTRAMUSCULAR | Status: AC
  Administered 2014-06-07: 4 mg via INTRAVENOUS

## 2014-06-07 MED ORDER — LACTATED RINGERS IV SOLN
INTRAVENOUS | Status: DC
  Administered 2014-06-07: 09:00:00 via INTRAVENOUS

## 2014-06-07 MED ORDER — CEFAZOLIN SODIUM-DEXTROSE 2-3 GM-% IV SOLR
2.0000 g | INTRAVENOUS | Status: AC
  Administered 2014-06-07: 2 g via INTRAVENOUS

## 2014-06-07 MED ORDER — HYDROCODONE-ACETAMINOPHEN 5-325 MG PO TABS: 1.0000 | ORAL_TABLET | Freq: Four times a day (QID) | ORAL | Status: DC | PRN

## 2014-06-07 MED ORDER — BUPIVACAINE HCL (PF) 0.5 % IJ SOLN
INTRAMUSCULAR | Status: DC | PRN
  Administered 2014-06-07: 2 mL

## 2014-06-07 MED ORDER — MIDAZOLAM HCL 5 MG/5ML IJ SOLN
INTRAMUSCULAR | Status: DC | PRN
  Administered 2014-06-07: 2 mg via INTRAVENOUS

## 2014-06-07 MED ORDER — MIDAZOLAM HCL 2 MG/2ML IJ SOLN
2.0000 mg | Freq: Once | INTRAMUSCULAR | Status: AC
  Administered 2014-06-07: 1 mg via INTRAVENOUS

## 2014-06-07 MED ORDER — FENTANYL CITRATE 0.05 MG/ML IJ SOLN
25.0000 ug | INTRAMUSCULAR | Status: AC
  Administered 2014-06-07 (×2): 25 ug via INTRAVENOUS

## 2014-06-07 MED ORDER — PROPOFOL 10 MG/ML IV EMUL
INTRAVENOUS | Status: AC
  Filled 2014-06-07: qty 20

## 2014-06-07 MED ORDER — BUPIVACAINE HCL (PF) 0.5 % IJ SOLN
INTRAMUSCULAR | Status: AC
  Filled 2014-06-07: qty 30

## 2014-06-07 MED ORDER — FENTANYL CITRATE 0.05 MG/ML IJ SOLN
INTRAMUSCULAR | Status: DC | PRN
  Administered 2014-06-07 (×2): 25 ug via INTRAVENOUS
  Administered 2014-06-07: 50 ug via INTRAVENOUS

## 2014-06-07 MED ORDER — CEFAZOLIN SODIUM-DEXTROSE 2-3 GM-% IV SOLR
INTRAVENOUS | Status: AC
  Filled 2014-06-07: qty 50

## 2014-06-07 MED ORDER — GLYCOPYRROLATE 0.2 MG/ML IJ SOLN
INTRAMUSCULAR | Status: AC
  Filled 2014-06-07: qty 10

## 2014-06-07 MED ORDER — LIDOCAINE HCL (PF) 1 % IJ SOLN
INTRAMUSCULAR | Status: AC
  Filled 2014-06-07: qty 5

## 2014-06-07 MED ORDER — 0.9 % SODIUM CHLORIDE (POUR BTL) OPTIME
TOPICAL | Status: DC | PRN
  Administered 2014-06-07: 1000 mL

## 2014-06-07 MED ORDER — ONDANSETRON HCL 4 MG/2ML IJ SOLN: 4.0000 mg | Freq: Once | INTRAMUSCULAR | Status: AC | PRN

## 2014-06-07 MED ORDER — LIDOCAINE HCL 1 % IJ SOLN
INTRAMUSCULAR | Status: DC | PRN
  Administered 2014-06-07: 5 mg via INTRADERMAL

## 2014-06-07 MED ORDER — GLYCOPYRROLATE 0.2 MG/ML IJ SOLN
0.2000 mg | Freq: Once | INTRAMUSCULAR | Status: AC
  Administered 2014-06-07: 0.2 mg via INTRAVENOUS

## 2014-06-07 MED ORDER — FENTANYL CITRATE 0.05 MG/ML IJ SOLN
INTRAMUSCULAR | Status: AC
  Filled 2014-06-07: qty 2

## 2014-06-07 MED ORDER — ONDANSETRON HCL 4 MG/2ML IJ SOLN
INTRAMUSCULAR | Status: AC
  Filled 2014-06-07: qty 2

## 2014-06-07 MED ORDER — FENTANYL CITRATE 0.05 MG/ML IJ SOLN: 25.0000 ug | INTRAMUSCULAR | Status: DC | PRN

## 2014-06-07 SURGICAL SUPPLY — 34 items
BENZOIN TINCTURE PRP APPL 2/3 (GAUZE/BANDAGES/DRESSINGS) ×2 IMPLANT
BLADE SURG 15 STRL LF DISP TIS (BLADE) ×1 IMPLANT
BLADE SURG 15 STRL SS (BLADE) ×1
CLOTH BEACON ORANGE TIMEOUT ST (SAFETY) ×2 IMPLANT
COVER LIGHT HANDLE STERIS (MISCELLANEOUS) ×4 IMPLANT
DECANTER SPIKE VIAL GLASS SM (MISCELLANEOUS) ×2 IMPLANT
DRESSING COVERLET 3X1 FLEXIBLE (GAUZE/BANDAGES/DRESSINGS) ×4 IMPLANT
DRSG TEGADERM 4X4.75 (GAUZE/BANDAGES/DRESSINGS) ×2 IMPLANT
DURAPREP 26ML APPLICATOR (WOUND CARE) ×2 IMPLANT
ELECT REM PT RETURN 9FT ADLT (ELECTROSURGICAL) ×2
ELECTRODE REM PT RTRN 9FT ADLT (ELECTROSURGICAL) ×1 IMPLANT
GLOVE BIO SURGEON STRL SZ 6.5 (GLOVE) ×2 IMPLANT
GLOVE BIO SURGEON STRL SZ8 (GLOVE) ×4 IMPLANT
GLOVE BIOGEL PI IND STRL 7.0 (GLOVE) ×1 IMPLANT
GLOVE BIOGEL PI IND STRL 7.5 (GLOVE) ×1 IMPLANT
GLOVE BIOGEL PI IND STRL 8 (GLOVE) ×2 IMPLANT
GLOVE BIOGEL PI INDICATOR 7.0 (GLOVE) ×1
GLOVE BIOGEL PI INDICATOR 7.5 (GLOVE) ×1
GLOVE BIOGEL PI INDICATOR 8 (GLOVE) ×2
GLOVE SS BIOGEL STRL SZ 8 (GLOVE) ×1 IMPLANT
GLOVE SUPERSENSE BIOGEL SZ 8 (GLOVE) ×1
GOWN STRL REUS W/ TWL LRG LVL3 (GOWN DISPOSABLE) ×2 IMPLANT
GOWN STRL REUS W/TWL 2XL LVL3 (GOWN DISPOSABLE) ×2 IMPLANT
GOWN STRL REUS W/TWL LRG LVL3 (GOWN DISPOSABLE) ×2
MARKER SKIN DUAL TIP RULER LAB (MISCELLANEOUS) ×2 IMPLANT
NEEDLE HYPO 18GX1.5 BLUNT FILL (NEEDLE) ×2 IMPLANT
NEEDLE HYPO 25X1 1.5 SAFETY (NEEDLE) ×4 IMPLANT
NS IRRIG 1000ML POUR BTL (IV SOLUTION) ×2 IMPLANT
PACK MINOR (CUSTOM PROCEDURE TRAY) ×2 IMPLANT
SPONGE GAUZE 2X2 8PLY STRL LF (GAUZE/BANDAGES/DRESSINGS) ×2 IMPLANT
STRIP CLOSURE SKIN 1/2X4 (GAUZE/BANDAGES/DRESSINGS) ×2 IMPLANT
SUT VIC AB 4-0 PS2 27 (SUTURE) ×2 IMPLANT
SYR BULB IRRIGATION 50ML (SYRINGE) ×2 IMPLANT
SYRINGE CONTROL L 12CC (SYRINGE) ×2 IMPLANT

## 2014-06-07 NOTE — Brief Op Note (Signed)
06/07/2014  11:46 AM  PATIENT:  [name]  28 y.o. female  PRE-OPERATIVE DIAGNOSIS:  REMOVAL OF IMPLANON right upper arm, ultrasound guided  POST-OPERATIVE DIAGNOSIS:  REMOVAL OF IMPLANON , ultrasound guided  PROCEDURE:  Procedure(s): REMOVAL OF DEEP IMPLANON IMPLANT, ULTRASOUND GUIDED, RIGHT UPPER ARM (Right)  SURGEON:  Surgeon(s) and Role:    * Tilda Burrow, MD - Primary  PHYSICIAN ASSISTANT:   ASSISTANTS: Dondra Prader ultrasound technician   ANESTHESIA:   local and general with LMA  EBL:  Total I/O In: 800 [I.V.:800] Out: -   BLOOD ADMINISTERED:none  DRAINS: none   LOCAL MEDICATIONS USED:  MARCAINE  2 cc   SPECIMEN:  No Specimen  DISPOSITION OF SPECIMEN:  N/A  COUNTS:  YES  TOURNIQUET:  * No tourniquets in log *  DICTATION: .Dragon Dictation  PLAN OF CARE: Discharge to home after PACU  PATIENT DISPOSITION:  PACU - hemodynamically stable.   Delay start of Pharmacological VTE agent (>24hrs) due to surgical blood loss or risk of bleeding: not applicable

## 2014-06-07 NOTE — Transfer of Care (Signed)
Immediate Anesthesia Transfer of Care Note  Patient: [name]  Procedure(s) Performed: Procedure(s): REMOVAL OF DEEP IMPLANON IMPLANT, ULTRASOUND GUIDED, RIGHT UPPER ARM (Right)  Patient Location: PACU  Anesthesia Type:General  Level of Consciousness: awake and patient cooperative  Airway & Oxygen Therapy: Patient Spontanous Breathing and Patient connected to face mask oxygen  Post-op Assessment: Report given to PACU RN, Post -op Vital signs reviewed and stable and Patient moving all extremities  Post vital signs: Reviewed and stable  Complications: No apparent anesthesia complications

## 2014-06-07 NOTE — Discharge Instructions (Signed)
Contraception Choices  Birth control (contraception) is the use of any methods or devices to stop pregnancy from happening. Below are some methods to help avoid pregnancy.  HORMONAL BIRTH CONTROL  · A small tube put under the skin of the upper arm (implant). The tube can stay in place for 3 years. The implant must be taken out after 3 years.  · Shots given every 3 months.  · Pills taken every day.  · Patches that are changed once a week.  · A ring put into the vagina (vaginal ring). The ring is left in place for 3 weeks and removed for 1 week. Then, a new ring is put in the vagina.  · Emergency birth control pills taken after unprotected sex (intercourse).  BARRIER BIRTH CONTROL   · A thin covering worn on the penis (female condom) during sex.  · A soft, loose covering put into the vagina (female condom) before sex.  · A rubber bowl that sits over the cervix (diaphragm). The bowl must be made for you. The bowl is put into the vagina before sex. The bowl is left in place for 6 to 8 hours after sex.  · A small, soft cup that fits over the cervix (cervical cap). The cup must be made for you. The cup can be left in place for 48 hours after sex.  · A sponge that is put into the vagina before sex.  · A chemical that kills or stops sperm from getting into the cervix and uterus (spermicide). The chemical may be a cream, jelly, foam, or pill.  INTRAUTERINE (IUD) BIRTH CONTROL   · IUD birth control is a small, T-shaped piece of plastic. The plastic is put inside the uterus. There are 2 types of IUD:  ¨ Copper IUD. The IUD is covered in copper wire. The copper makes a fluid that kills sperm. It can stay in place for 10 years.  ¨ Hormone IUD. The hormone stops pregnancy from happening. It can stay in place for 5 years.  PERMANENT METHODS  · When the woman has her fallopian tubes sealed, tied, or blocked during surgery. This stops the egg from traveling to the uterus.  · The doctor places a small coil or insert into each fallopian  tube. This causes scar tissue to form and blocks the fallopian tubes.  · When the female has the tubes that carry sperm tied off (vasectomy).  NATURAL FAMILY PLANNING BIRTH CONTROL   · Natural family planning means not having sex or using barrier birth control on the days the woman could become pregnant.  · Use a calendar to keep track of the length of each period and know the days she can get pregnant.  · Avoid sex during ovulation.  · Use a thermometer to measure body temperature. Also watch for symptoms of ovulation.  · Time sex to be after the woman has ovulated.  Use condoms to help protect yourself against sexually transmitted infections (STIs). Do this no matter what type of birth control you use. Talk to your doctor about which type of birth control is best for you.  Document Released: 07/12/2009 Document Revised: 09/19/2013 Document Reviewed: 04/05/2013  [employer]® Patient Information ©2015 [employer]. This information is not intended to replace advice given to you by your health care provider. Make sure you discuss any questions you have with your health care provider.

## 2014-06-07 NOTE — Anesthesia Postprocedure Evaluation (Signed)
  Anesthesia Post-op Note  Patient: [name]  Procedure(s) Performed: Procedure(s): REMOVAL OF DEEP IMPLANON IMPLANT, ULTRASOUND GUIDED, RIGHT UPPER ARM (Right)  Patient Location: PACU  Anesthesia Type:General  Level of Consciousness: awake, alert , oriented and patient cooperative  Airway and Oxygen Therapy: Patient Spontanous Breathing  Post-op Pain: 2 /10, mild  Post-op Assessment: Post-op Vital signs reviewed, Patient's Cardiovascular Status Stable, Respiratory Function Stable, Patent Airway, No signs of Nausea or vomiting and Pain level controlled  Post-op Vital Signs: Reviewed and stable  Last Vitals:  Filed Vitals:   06/07/14 1010  BP: 94/57  Pulse:   Temp:   Resp: 15    Complications: No apparent anesthesia complications

## 2014-06-07 NOTE — Anesthesia Preprocedure Evaluation (Signed)
Anesthesia Evaluation  Patient identified by MRN, date of birth, ID band Patient awake    Reviewed: Allergy & Precautions, H&P , NPO status , Patient's Chart, lab work & pertinent test results  Airway Mallampati: II TM Distance: >3 FB     Dental  (+) Teeth Intact   Pulmonary neg pulmonary ROS,  breath sounds clear to auscultation        Cardiovascular negative cardio ROS  Rhythm:Regular Rate:Normal     Neuro/Psych    GI/Hepatic negative GI ROS,   Endo/Other    Renal/GU      Musculoskeletal   Abdominal   Peds  Hematology   Anesthesia Other Findings   Reproductive/Obstetrics                           Anesthesia Physical Anesthesia Plan  ASA: I  Anesthesia Plan: General   Post-op Pain Management:    Induction: Intravenous  Airway Management Planned: LMA  Additional Equipment:   Intra-op Plan:   Post-operative Plan: Extubation in OR  Informed Consent: I have reviewed the patients History and Physical, chart, labs and discussed the procedure including the risks, benefits and alternatives for the proposed anesthesia with the patient or authorized representative who has indicated his/her understanding and acceptance.     Plan Discussed with:   Anesthesia Plan Comments:         Anesthesia Quick Evaluation  

## 2014-06-07 NOTE — H&P (Signed)
  Subjective:   Patient ID: [name], female DOB: 02/12/1986, 27 y.o. MRN: [id number]  HPI the patient returns requesting scheduling of removal of Implanon from her right arm. She had efforts at removal in the office were unsuccessful even though she had the location of the Implanon mark and ultrasound in the office before presenting to our office for efforts at removal.  She now desires being taken to the OR for removal. She has required insurance and desires to precede as soon as possible  She will require ultrasound guidance in the operating room to achieve removal.  Review of Systems  she is still not having periods due to the implant  Objective:   Physical Exam  Constitutional: She is oriented to person, place, and time. She appears well-developed and well-nourished.  Eyes: Pupils are equal, round, and reactive to light.  Neck: Normal range of motion. Neck supple.  Cardiovascular: Normal rate.  Pulmonary/Chest: Effort normal.  Abdominal: Soft.  Neurological: She is alert and oriented to person, place, and time.  Skin: Skin is warm and dry.  Right upper arm has a well-healed surgical scar where we attempted to remove the Implanon in the office. The Implanon cannot be felt on palpation  Psychiatric: She has a normal mood and affect. Her behavior is normal. Thought content normal.  the side of exploration for the Implanon was approximately 1 cm lateral to the site where the Implanon had been inserted  Assessment:   Retained Implanon, due to the deep placement  Plan  Plan:   Ultrasound guidance removal  In the operating room To be performed 10 am 06/07/14

## 2014-06-07 NOTE — Anesthesia Procedure Notes (Signed)
Procedure Name: LMA Insertion Date/Time: 06/07/2014 10:24 AM Performed by: Despina Hidden Pre-anesthesia Checklist: Emergency Drugs available, Patient identified, Suction available and Patient being monitored Patient Re-evaluated:Patient Re-evaluated prior to inductionOxygen Delivery Method: Circle system utilized Preoxygenation: Pre-oxygenation with 100% oxygen Intubation Type: IV induction Ventilation: Mask ventilation without difficulty LMA: LMA inserted LMA Size: 3.0 Grade View: Grade II Tube type: Oral Number of attempts: 1 Placement Confirmation: positive ETCO2 and breath sounds checked- equal and bilateral Tube secured with: Tape Dental Injury: Teeth and Oropharynx as per pre-operative assessment

## 2014-06-07 NOTE — Op Note (Signed)
06/07/2014  11:46 AM  PATIENT:  [name]  28 y.o. female  PRE-OPERATIVE DIAGNOSIS:  REMOVAL OF IMPLANON right upper arm, ultrasound guided  POST-OPERATIVE DIAGNOSIS:  REMOVAL OF IMPLANON , ultrasound guided  PROCEDURE:  Procedure(s): REMOVAL OF DEEP IMPLANON IMPLANT, ULTRASOUND GUIDED, RIGHT UPPER ARM (Right)  SURGEON:  Surgeon(s) and Role:    * Tilda Burrow, MD - Primary  PHYSICIAN ASSISTANT:   ASSISTANTS: Dondra Prader ultrasound technician   ANESTHESIA:   local and general with LMA  EBL:  Total I/O In: 800 [I.V.:800] Out: -   BLOOD ADMINISTERED:none  DRAINS: none   LOCAL MEDICATIONS USED:  MARCAINE  2 cc   SPECIMEN:  No Specimen  DISPOSITION OF SPECIMEN:  N/A  COUNTS:  YES  TOURNIQUET:  * No tourniquets in log *  DICTATION: .Dragon Dictation  PLAN OF CARE: Discharge to home after PACU  PATIENT DISPOSITION:  PACU - hemodynamically stable.   Delay start of Pharmacological VTE agent (>24hrs) due to surgical blood loss or risk of bleeding: not applicable   Details of procedure: Patient taken operating room prepped and draped after timeout conducted. Prior to the prepping and draping Richard or drug ultrasound tech and identified the location of the device in the midportion of the right arm, with the device located in the surface of the muscle approximately 1 cm lateral to the neurovascular bundle, the brachial artery vein and nerve. After prepping and draping, ultrasound was once again used to confirm the position of the implant and due to scarring from prior removal attempts in the office, the initial attempt was to make a 1 cm incision at the medial and the device with sharp dissection through the skin and then blunt dissection trying to identify the surface of the muscle. The fatty tissues did not allow adequate visualization through this site and despite use of hemostats to spread the tissues were unable to identify the tip. Repeat ultrasound showed that the  device was slightly more deep in its position in the medial aspect so decision was made to reattempt removal from the distal end of the device. Ultrasound was again used to confirm there were well away from nerves and arteries, and an incision linear oriented, in 1-2 mm medial to the previous removal at attempt was then made, approximately 1.5 cm in length. Fatty tissue was bluntly spread reaching down to the deeper fascial connective tissue. Hemostats could be used to grasp the surface of the muscle capsule. Ultrasound was again used to confirm that this was indeed immediately overlying the device. A significant amount of time was required to carefully spread the tissues, with a small amount of sharp dissection performed to reach the surface of the muscle. I repositioning myself today beside the patient's head and, and with lateral abduction with the hemostat could identify the Implanon lying on the surface of the muscle capsule. Hemostatic and then the used to grasp the Implanon approximately 1 cm from the distal and. Sharp dissection was then performed to disrupt the capsule and allowed the device to be pulled out of the scar tissue. The device was then extracted in its entirety, measured and confirmed as 4 cm in length and intact. The arm was quite hemostatic. The 2 surgical sites were copiously irrigated, then the distal site closed with 2 layer closure of interrupted 4-0 Vicryl, one suture placed deep in 2 suture superficial and the proximal 1 cm incision closed with subcuticular interrupted 4-0 Vicryl.. 2 cc of Marcaine was injected  in the skin surrounding the incision, Steri-Strips applied and prepped dressing applied sponge and needle counts were correct EBL minimal

## 2014-06-08 ENCOUNTER — Encounter (HOSPITAL_COMMUNITY): Payer: Self-pay | Admitting: Obstetrics and Gynecology

## 2014-06-08 ENCOUNTER — Telehealth: Payer: Self-pay | Admitting: Obstetrics and Gynecology

## 2014-06-08 NOTE — Telephone Encounter (Signed)
Pt was unsure of what to do to clean site. I spoke with Dr. Emelda Fear and he advised that the pt could remove the dressing in 48 hours and she could shower in 48 hours. Pt was advised of signs of infection and told to call us back if anything changes. Pt verbalized understanding.

## 2014-06-18 ENCOUNTER — Ambulatory Visit (INDEPENDENT_AMBULATORY_CARE_PROVIDER_SITE_OTHER): Payer: 59 | Admitting: Obstetrics and Gynecology

## 2014-06-18 ENCOUNTER — Encounter: Payer: Self-pay | Admitting: Obstetrics and Gynecology

## 2014-06-18 VITALS — BP 120/76 | Ht 63.0 in | Wt 185.0 lb

## 2014-06-18 DIAGNOSIS — Z9889 Other specified postprocedural states: Secondary | ICD-10-CM

## 2014-06-18 NOTE — Progress Notes (Signed)
Status post-op: removal of deep Implanon implant  Subjective: Patient reports no medical problems.  She would like to discuss contraception option.  She is not interested in birth control pills.   Objective: I have reviewed patient's vital signs and medications.  Physical Exam: Skin: Well healing surgical scar on the interior aspect of the right arm.   Assessment: 1. s/p removal of deep Implanon implant: stable and progressing well 2. Contraception management - Nuva Ring  Plan: 1. Nuva Ring 2. Return PRN    Corey Harold Holson 06/18/2014, 4:49 PM    This chart was scribed for Tilda Burrow, MD by Carl Best, ED Scribe. This patient was seen in room Room 2 and the patient's care was started at 4:49 PM.

## 2014-06-18 NOTE — Progress Notes (Signed)
Patient ID: [name], female   DOB: [birth year], 28 y.o.   MRN: [id number] Pt here today for post op visit. Pt denies any problems or issues at this time.

## 2014-07-30 ENCOUNTER — Encounter: Payer: Self-pay | Admitting: Obstetrics and Gynecology

## 2016-03-05 ENCOUNTER — Encounter (HOSPITAL_COMMUNITY): Payer: 59 | Attending: Genetic Counselor | Admitting: Genetic Counselor

## 2016-03-05 ENCOUNTER — Other Ambulatory Visit (HOSPITAL_COMMUNITY): Payer: 59

## 2016-03-05 DIAGNOSIS — Z315 Encounter for genetic counseling: Secondary | ICD-10-CM | POA: Diagnosis not present

## 2016-03-05 DIAGNOSIS — Z8481 Family history of carrier of genetic disease: Secondary | ICD-10-CM | POA: Diagnosis not present

## 2016-03-05 DIAGNOSIS — Z803 Family history of malignant neoplasm of breast: Secondary | ICD-10-CM

## 2016-03-06 ENCOUNTER — Encounter (HOSPITAL_COMMUNITY): Payer: Self-pay | Admitting: Genetic Counselor

## 2016-03-06 DIAGNOSIS — Z803 Family history of malignant neoplasm of breast: Secondary | ICD-10-CM | POA: Insufficient documentation

## 2016-03-06 DIAGNOSIS — Z8481 Family history of carrier of genetic disease: Secondary | ICD-10-CM | POA: Insufficient documentation

## 2016-03-06 NOTE — Progress Notes (Signed)
REFERRING PROVIDER: Sharilyn Sites, MD [place], [place] [address]  Ancil Linsey, MD PRIMARY PROVIDER:  Purvis Kilts, MD  PRIMARY REASON FOR VISIT:  1. Family history of BRCA1 gene positive   2. Family history of breast cancer      HISTORY OF PRESENT ILLNESS:   [name], a 30 y.o. female, was seen for a [facility] [place] cancer genetics consultation at the request of Dr. Whitney Muse due to a family history of cancer.  [name] presents to clinic today to discuss the possibility of a hereditary predisposition to cancer, genetic testing, and to further clarify her future cancer risks, as well as potential cancer risks for family members. [name] is a 30 y.o. female with no personal history of cancer.  Ms. [name] mother was diagnosed with breast cancer at age 18. She underwent genetic testing and was found to have a BRCA1 mutation. Ms. [name] attended the visit to learn about the results of her genetic testing and decided to pursue genetic testing as well.   CANCER HISTORY:   No history exists.      Past Medical History  Diagnosis Date  . Medical history non-contributory   . Family history of breast cancer   . Family history of BRCA1 gene positive     Past Surgical History  Procedure Laterality Date  . No past surgeries    . Removal of non vaginal contraceptive device Right 06/07/2014    Procedure: REMOVAL OF DEEP IMPLANON IMPLANT, ULTRASOUND GUIDED, RIGHT UPPER ARM;  Surgeon: Jonnie Kind, MD;  Location: [facility];  Service: Gynecology;  Laterality: Right;    Social History   Social History  . Marital Status: Married    Spouse Name: N/A  . Number of Children: N/A  . Years of Education: N/A   Social History Main Topics  . Smoking status: Never Smoker   . Smokeless tobacco: Never Used  . Alcohol Use: No  . Drug Use: No  . Sexual Activity: Yes    Birth Control/ Protection: None   Other Topics Concern  . None   Social History Narrative      FAMILY HISTORY:  We obtained a detailed, 4-generation family history.  Significant diagnoses are listed below: Family History  Problem Relation Age of Onset  . Diabetes Mother   . Hypertension Mother   . Breast cancer Mother 59  . Stomach cancer Maternal Grandfather   . Breast cancer Maternal Aunt 60  . Stomach cancer Maternal Aunt     The patient has five maternal sisters who are healthy. Her mother is alive and was recently diagnosed with a BRCA1 mutation. Her mother has seven siblings. One sister had breast cancer in her 81s and another sister had stomach cancer. The patient's maternal grandfather was diagnosed with stomach cancer and died in his 67s. Patient's maternal ancestors are of Hispanic descent, and paternal ancestors are of Hispanic descent. There is no reported Ashkenazi Jewish ancestry. There is no known consanguinity.  GENETIC COUNSELING ASSESSMENT: [name] is a 30 y.o. female with a family history of early onset breast cancer and a known family mutation in [place] which is somewhat suggestive of a hereditary breast and ovarian cancer syndrome. We, therefore, discussed and recommended the following at today's visit.   DISCUSSION: We discussed that about 5-10% of breast cancer is hereditary with most cases due to BRCA mutations. The patient's mother underwent genetic testing and was found to carry a large deletion in [place]. Women with  BRCA mutations are at an increased risk for breast and ovarian cancer as well as a small increased risk for pancreatic cancer. Men also have an increased risk for female breast cancer and prostate cancer. It is important to identify both men and women who carry these mutations so that we can appropriately screen them, or provide propylactic surgery, in order to prevent a cancer from occuring or identify a cancer when it is more treatable.   NCCN guidelines were reviewed regarding screening and management should the patient be positive.  [name] has a 50% chance of also having the BRCA1 mutation found in her mother. Ms. [name] voiced her desire to undergo genetic testing. We reviewed the characteristics, features and inheritance patterns of hereditary cancer syndromes. We also discussed genetic testing, including the appropriate family members to test, the process of testing, insurance coverage and turn-around-time for results. We discussed the implications of a negative, positive and/or variant of uncertain significant result. We recommended [name] pursue genetic testing for the BRCA1 gene.   Based on [name]'s family history of cancer, she meets medical criteria for genetic testing. [name] does not have health insurance and therefore we will go through [facility] [place] program for testing. As part of this testing, she will be tested for the full [facility] panel genes. The [facility] gene panel offered by [employer] includes sequencing and deletion/duplication testing of the following 27 genes: APC, ATM, BARD1, BMPR1A, BRCA1, BRCA2, BRIP1, CHD1, CDK4, CDKN2A, CHEK2, EPCAM (large rearrangement only), MLH1, MSH2, MSH6, MUTYH, NBN, PALB2, PMS2, PTEN, RAD51C, RAD51D, SMAD4, STK11, and TP53. Sequencing was performed for select regions of POLE and POLD1, and large rearrangement analysis was performed for select regions of GREM1.   PLAN: After considering the risks, benefits, and limitations, [name] provided informed consent to pursue genetic testing and the blood sample was sent to [facility] for analysis of the BRCA1 gene mutation and also the [facility] panel. Results should be available within approximately 2-3 weeks' time, at which point they will be disclosed by telephone to [name], as will any additional recommendations warranted by these results. [name] receive a summary of her genetic counseling visit and a copy of her results once available. This  information will also be available in Epic. We encouraged [name] to remain in contact with cancer genetics annually so that we can continuously update the family history and inform her of any changes in cancer genetics and testing that may be of benefit for her family. Ms. Javier Glazier questions were answered to her satisfaction today. Our contact information was provided should additional questions or concerns arise.  Lastly, we encouraged [name] to remain in contact with cancer genetics annually so that we can continuously update the family history and inform her of any changes in cancer genetics and testing that may be of benefit for this family.   Ms. Javier Glazier questions were answered to her satisfaction today. Our contact information was provided should additional questions or concerns arise. Thank you for the referral and allowing [place] to share in the care of your patient.   Karen P. Florene Glen, [place], [facility] Certified Genetic Counselor [name] phone: [phone]  The patient was seen for a total of 30 minutes in face-to-face genetic counseling. This patient was discussed with Drs. Magrinat, Lindi Adie and/or Burr Medico who agrees with the above.  _______________________________________________________________________ For Office Staff:  Number of people involved in session: 5 Was an Intern/ student involved with case: no

## 2016-03-13 DIAGNOSIS — Z803 Family history of malignant neoplasm of breast: Secondary | ICD-10-CM | POA: Diagnosis not present

## 2016-03-13 DIAGNOSIS — Z8481 Family history of carrier of genetic disease: Secondary | ICD-10-CM | POA: Diagnosis not present

## 2016-03-30 ENCOUNTER — Encounter: Payer: Self-pay | Admitting: Genetic Counselor

## 2016-03-30 ENCOUNTER — Telehealth: Payer: Self-pay | Admitting: Genetic Counselor

## 2016-03-30 DIAGNOSIS — Z1379 Encounter for other screening for genetic and chromosomal anomalies: Secondary | ICD-10-CM | POA: Insufficient documentation

## 2016-03-30 NOTE — Telephone Encounter (Signed)
LM with good news on the test results.  Asked that she CB.  Let her know that we will be closed on 7/4.

## 2016-04-01 ENCOUNTER — Telehealth: Payer: Self-pay | Admitting: Genetic Counselor

## 2016-04-01 NOTE — Telephone Encounter (Signed)
Revealed negative genetic testing on the Breast/Ovarian cancer panel.  Specifically, patient was negative for the BRCA1 mutation that was found in her mother.  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  Discussed that VUSs are typically reclassified in the future, and most are identified as harmless.  Therefore, the patient is not at an increased risk, over population risk, for developing breast or ovarian cancer.  Patient voiced her understanding.

## 2016-04-01 NOTE — Telephone Encounter (Signed)
LM on VM that test results are back.  We have good news.  Please call back.  Left phone number.

## 2016-04-02 ENCOUNTER — Ambulatory Visit: Payer: Self-pay | Admitting: Genetic Counselor

## 2016-04-02 DIAGNOSIS — Z803 Family history of malignant neoplasm of breast: Secondary | ICD-10-CM

## 2016-04-02 DIAGNOSIS — Z8481 Family history of carrier of genetic disease: Secondary | ICD-10-CM

## 2016-04-02 DIAGNOSIS — Z1379 Encounter for other screening for genetic and chromosomal anomalies: Secondary | ICD-10-CM

## 2016-04-02 NOTE — Progress Notes (Signed)
HPI: [name] was previously seen in the [place] clinic due to her mother being identified with a BRCA1 mutation, a fmaily history of cancer and concerns regarding a hereditary predisposition to cancer. Please refer to our prior cancer genetics clinic note for more information regarding [name]'s medical, social and family histories, and our assessment and recommendations, at the time. [name] recent genetic test results were disclosed to her, as were recommendations warranted by these results. These results and recommendations are discussed in more detail below.  FAMILY HISTORY:  We obtained a detailed, 4-generation family history.  Significant diagnoses are listed below: Family History  Problem Relation Age of Onset  . Diabetes Mother   . Hypertension Mother   . Breast cancer Mother 51  . Stomach cancer Maternal Grandfather   . Breast cancer Maternal Aunt 60  . Stomach cancer Maternal Aunt     The patient has five maternal sisters who are healthy. Her mother is alive and was recently diagnosed with a BRCA1 mutation. Her mother has seven siblings. One sister had breast cancer in her 61s and another sister had stomach cancer. The patient's maternal grandfather was diagnosed with stomach cancer and died in his 32s. The patient's father left the family when she was an infant and no information is known about him.  Patient's maternal ancestors are of Hispanic descent, and paternal ancestors are of Hispanic descent. There is no reported Ashkenazi Jewish ancestry. There is no known consanguinity.  GENETIC TEST RESULTS: We recommended [name] pursue testing for the familial hereditary cancer gene mutation called BRCA1, Deletion Exons 8-11 (alternative nomenclature is Deletion Exons 9-12). [name] test was normal and did not reveal the familial mutation. We call this result a true negative result because the cancer-causing mutation was identified in [name]'s  family, and she did not inherit it.  Given this negative result, [name]'s chances of developing BRCA-related cancers are the same as they are in the general population.   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 We discussed that this variant was not inherited from her mother and therefore must have been inherited from her father.  At this time, it is unknown if this variant is associated with increased cancer risk or if this is a normal finding, but most variants such as this get reclassified to being inconsequential. It should not be used to make medical management decisions. With time, we suspect the lab will determine the significance of this variant, if any. If we do learn more about it, we will try to contact [name] to discuss it further. However, it is important to stay in touch with [place] periodically and keep the address and phone number up to date.    ADDITIONAL GENETIC TESTING: We discussed with [name] that there are other genes that are associated with increased cancer risk that can be analyzed. The laboratories that offer such testing look at these additional genes via a hereditary cancer gene panel. Should [name] wish to pursue additional genetic testing, we are happy to discuss and coordinate this testing, at any time.    CANCER SCREENING RECOMMENDATIONS: This normal result is reassuring and indicates that [name] does not likely have an increased risk of cancer due to a mutation in one of these genes.  We, therefore, recommended  [name] continue to follow the cancer screening guidelines provided by her primary healthcare providers.   RECOMMENDATIONS FOR  FAMILY MEMBERS: Women in this family might be at some increased risk of developing cancer, over the general population risk, simply due to the family history of cancer. We recommended that individuals, both men and women, get tested for the known family mutation.   Individuals who are positive for the mutation should be followed based on the current NCCN guidelines.  Individuals who test negative for the known family mutation women in this family have a yearly mammogram beginning at age 62, an annual clinical breast exam, and perform monthly breast self-exams. Women in this family should also have a gynecological exam as recommended by their primary provider. All family members should have a colonoscopy by age 55.  FOLLOW-UP: Lastly, we discussed with [name] that cancer genetics is a rapidly advancing field and it is possible that new genetic tests will be appropriate for her and/or her family members in the future. We encouraged her to remain in contact with cancer genetics on an annual basis so we can update her personal and family histories and let her know of advances in cancer genetics that may benefit this family.   Our contact number was provided. [name] questions were answered to her satisfaction, and she knows she is welcome to call us at anytime with additional questions or concerns.   Roma Kayser, MS, [facility] Certified Genetic Counselor [name]

## 2016-04-11 ENCOUNTER — Encounter (HOSPITAL_COMMUNITY): Payer: Self-pay

## 2016-05-11 DIAGNOSIS — Z6827 Body mass index (BMI) 27.0-27.9, adult: Secondary | ICD-10-CM | POA: Diagnosis not present

## 2016-05-11 DIAGNOSIS — Z1389 Encounter for screening for other disorder: Secondary | ICD-10-CM | POA: Diagnosis not present

## 2016-05-11 DIAGNOSIS — E663 Overweight: Secondary | ICD-10-CM | POA: Diagnosis not present

## 2016-05-11 DIAGNOSIS — R87615 Unsatisfactory cytologic smear of cervix: Secondary | ICD-10-CM | POA: Diagnosis not present

## 2016-05-11 DIAGNOSIS — Z01411 Encounter for gynecological examination (general) (routine) with abnormal findings: Secondary | ICD-10-CM | POA: Diagnosis not present

## 2016-05-18 DIAGNOSIS — E559 Vitamin D deficiency, unspecified: Secondary | ICD-10-CM | POA: Diagnosis not present

## 2016-05-18 DIAGNOSIS — E538 Deficiency of other specified B group vitamins: Secondary | ICD-10-CM | POA: Diagnosis not present

## 2016-05-18 DIAGNOSIS — Z0000 Encounter for general adult medical examination without abnormal findings: Secondary | ICD-10-CM | POA: Diagnosis not present

## 2016-05-18 DIAGNOSIS — E039 Hypothyroidism, unspecified: Secondary | ICD-10-CM | POA: Diagnosis not present

## 2016-08-10 DIAGNOSIS — Z0000 Encounter for general adult medical examination without abnormal findings: Secondary | ICD-10-CM | POA: Diagnosis not present

## 2017-09-17 DIAGNOSIS — Z1389 Encounter for screening for other disorder: Secondary | ICD-10-CM | POA: Diagnosis not present

## 2017-09-17 DIAGNOSIS — Z23 Encounter for immunization: Secondary | ICD-10-CM | POA: Diagnosis not present

## 2017-09-17 DIAGNOSIS — Z0001 Encounter for general adult medical examination with abnormal findings: Secondary | ICD-10-CM | POA: Diagnosis not present

## 2017-09-17 DIAGNOSIS — Z6827 Body mass index (BMI) 27.0-27.9, adult: Secondary | ICD-10-CM | POA: Diagnosis not present

## 2017-09-17 DIAGNOSIS — E663 Overweight: Secondary | ICD-10-CM | POA: Diagnosis not present

## 2017-09-23 DIAGNOSIS — Z0001 Encounter for general adult medical examination with abnormal findings: Secondary | ICD-10-CM | POA: Diagnosis not present

## 2017-09-23 DIAGNOSIS — Z23 Encounter for immunization: Secondary | ICD-10-CM | POA: Diagnosis not present

## 2017-09-23 DIAGNOSIS — Z1389 Encounter for screening for other disorder: Secondary | ICD-10-CM | POA: Diagnosis not present

## 2019-08-22 ENCOUNTER — Emergency Department (HOSPITAL_COMMUNITY)
Admission: EM | Admit: 2019-08-22 | Discharge: 2019-08-22 | Discharge status: Against Medical Advice | Payer: Managed Care, Other (non HMO) | Attending: Emergency Medicine | Admitting: Emergency Medicine

## 2019-08-22 ENCOUNTER — Emergency Department (HOSPITAL_COMMUNITY): Payer: Managed Care, Other (non HMO)

## 2019-08-22 ENCOUNTER — Encounter (HOSPITAL_COMMUNITY): Payer: Self-pay | Admitting: Emergency Medicine

## 2019-08-22 ENCOUNTER — Other Ambulatory Visit: Payer: Self-pay

## 2019-08-22 DIAGNOSIS — Z5320 Procedure and treatment not carried out because of patient's decision for unspecified reasons: Secondary | ICD-10-CM | POA: Insufficient documentation

## 2019-08-22 DIAGNOSIS — R0602 Shortness of breath: Secondary | ICD-10-CM | POA: Insufficient documentation

## 2019-08-22 DIAGNOSIS — R002 Palpitations: Secondary | ICD-10-CM | POA: Insufficient documentation

## 2019-08-22 DIAGNOSIS — R110 Nausea: Secondary | ICD-10-CM | POA: Diagnosis not present

## 2019-08-22 LAB — CBC WITH DIFFERENTIAL/PLATELET
Abs Immature Granulocytes: 0.02 10*3/uL (ref 0.00–0.07)
Basophils Absolute: 0 10*3/uL (ref 0.0–0.1)
Basophils Relative: 0 %
Eosinophils Absolute: 0.1 10*3/uL (ref 0.0–0.5)
Eosinophils Relative: 2 %
HCT: 38.9 % (ref 36.0–46.0)
Hemoglobin: 12.1 g/dL (ref 12.0–15.0)
Immature Granulocytes: 0 %
Lymphocytes Relative: 43 %
Lymphs Abs: 2.3 10*3/uL (ref 0.7–4.0)
MCH: 29.6 pg (ref 26.0–34.0)
MCHC: 31.1 g/dL (ref 30.0–36.0)
MCV: 95.1 fL (ref 80.0–100.0)
Monocytes Absolute: 0.5 10*3/uL (ref 0.1–1.0)
Monocytes Relative: 9 %
Neutro Abs: 2.5 10*3/uL (ref 1.7–7.7)
Neutrophils Relative %: 46 %
Platelets: 293 10*3/uL (ref 150–400)
RBC: 4.09 MIL/uL (ref 3.87–5.11)
RDW: 12.5 % (ref 11.5–15.5)
WBC: 5.4 10*3/uL (ref 4.0–10.5)
nRBC: 0 % (ref 0.0–0.2)

## 2019-08-22 LAB — BASIC METABOLIC PANEL
Anion gap: 7 (ref 5–15)
BUN: 10 mg/dL (ref 6–20)
CO2: 25 mmol/L (ref 22–32)
Calcium: 9 mg/dL (ref 8.9–10.3)
Chloride: 103 mmol/L (ref 98–111)
Creatinine, Ser: 0.61 mg/dL (ref 0.44–1.00)
GFR calc Af Amer: >60 mL/min (ref >60)
GFR calc non Af Amer: >60 mL/min (ref >60)
Glucose, Bld: 82 mg/dL (ref 70–99)
Potassium: 3.8 mmol/L (ref 3.5–5.1)
Sodium: 135 mmol/L (ref 135–145)

## 2019-08-22 LAB — TROPONIN I (HIGH SENSITIVITY)
Troponin I (High Sensitivity): <2 ng/L (ref <18)
Troponin I (High Sensitivity): <2 ng/L (ref <18)

## 2019-08-22 LAB — HCG, QUANTITATIVE, PREGNANCY: hCG, Beta Chain, Quant, S: <1 m[IU]/mL (ref <5)

## 2019-08-22 LAB — TSH: TSH: 0.868 u[IU]/mL (ref 0.350–4.500)

## 2019-08-22 LAB — D-DIMER, QUANTITATIVE: D-Dimer, Quant: 2.11 ug/mL-FEU — ABNORMAL HIGH (ref 0.00–0.50)

## 2019-08-22 MED ORDER — IOHEXOL 350 MG/ML SOLN: 100.0000 mL | Freq: Once | INTRAVENOUS | Status: DC | PRN

## 2019-08-22 NOTE — ED Triage Notes (Signed)
PT states intermittent palpitations x2 weeks. PT states at times she feels like her heart is racing and skipping and it will last around 10 minutes and causes her to be SOB.

## 2019-08-22 NOTE — ED Notes (Signed)
Went into patient's room. Patient not in room.

## 2019-08-22 NOTE — ED Provider Notes (Signed)
[facility] EMERGENCY DEPARTMENT Provider Note   CSN: [id number] Arrival date & time: 08/22/19  1330     History   Chief Complaint Chief Complaint  Patient presents with  . Palpitations    HPI [name] is a 33 y.o. female who presents to the ED today complaining of sudden onset, intermittent, heart palpitations that began 2 weeks ago. Pt reports that she had 1-2 episodes daily for the first couple of days and now has multiple episodes throughout each day; she reports the episodes can last anywhere from a few seconds to a couple of minutes before subsiding.  It and states that when she has these episodes it alarmed her that her heart rate is in the 110s.  She reports her normal resting heart rate is anywhere from 60 to 80 bpm.  Patient reports that she feels nauseated when his episodes occur as well as shortness of breath.  Patient denies any recent sick contacts or COVID-19 positive exposure.  No chest pain when these symptoms occur.  No recent prolonged travel or immobilization.  No history DVT/PE.  No exogenous hormone use.  Patient does use birth control transdermal patch.  No hemoptysis.  No active malignancy.  Patient denies fever, chills, chest pain, vomiting, abdominal pain, diarrhea, constipation, leg swelling, any other associated symptoms.        Past Medical History:  Diagnosis Date  . Family history of BRCA1 gene positive   . Family history of breast cancer   . Medical history non-contributory     Patient Active Problem List   Diagnosis Date Noted  . Genetic testing 03/30/2016  . Family history of breast cancer   . Family history of BRCA1 gene positive     Past Surgical History:  Procedure Laterality Date  . NO PAST SURGERIES    . REMOVAL OF NON VAGINAL CONTRACEPTIVE DEVICE Right 06/07/2014   Procedure: REMOVAL OF DEEP IMPLANON IMPLANT, ULTRASOUND GUIDED, RIGHT UPPER ARM;  Surgeon: Jonnie Kind, MD;  Location: [facility];  Service: Gynecology;  Laterality:  Right;     OB History    Gravida  4   Para  2   Term  2   Preterm      AB  2   Living  2     SAB  2   TAB      Ectopic      Multiple      Live Births  2            Home Medications    Prior to Admission medications   Medication Sig Start Date End Date Taking? Authorizing Provider  HYDROcodone-acetaminophen (NORCO/VICODIN) 5-325 MG per tablet Take 1 tablet by mouth every 6 (six) hours as needed. 06/07/14   Jonnie Kind, MD  Marilu Favre 150-35 MCG/24HR transdermal patch Place 1 patch onto the skin See admin instructions. Apply one patch transdermally once weekly, then skip one week 08/06/19   [provider]    Family History Family History  Problem Relation Age of Onset  . Diabetes Mother   . Hypertension Mother   . Breast cancer Mother 33  . Stomach cancer Maternal Grandfather   . Breast cancer Maternal Aunt 60  . Stomach cancer Maternal Aunt     Social History Social History   Tobacco Use  . Smoking status: Never Smoker  . Smokeless tobacco: Never Used  Substance Use Topics  . Alcohol use: No  . Drug use: No  Allergies   Patient has no known allergies.   Review of Systems Review of Systems  Constitutional: Negative for chills and fever.  HENT: Negative for congestion.   Respiratory: Positive for shortness of breath. Negative for cough.   Cardiovascular: Positive for palpitations. Negative for chest pain and leg swelling.  Gastrointestinal: Positive for nausea. Negative for abdominal pain, constipation, diarrhea and vomiting.  All other systems reviewed and are negative.    Physical Exam Updated Vital Signs BP 123/70   Pulse 79   Temp 98.7 F (37.1 C)   Resp 16   Ht '5\' 3"'  (1.6 m)   Wt 72.6 kg   LMP 08/18/2019   SpO2 100%   BMI 28.34 kg/m   Physical Exam Vitals signs and nursing note reviewed.  Constitutional:      Appearance: She is not ill-appearing or diaphoretic.     Comments: Sitting upright comfortably in  bed  HENT:     Head: Normocephalic and atraumatic.  Eyes:     Conjunctiva/sclera: Conjunctivae normal.  Neck:     Musculoskeletal: Neck supple.  Cardiovascular:     Rate and Rhythm: Normal rate and regular rhythm.     Pulses: Normal pulses.     Comments: Heart rate in the 70's during exam Pulmonary:     Effort: Pulmonary effort is normal.     Breath sounds: Normal breath sounds. No wheezing, rhonchi or rales.  Abdominal:     Palpations: Abdomen is soft.     Tenderness: There is no abdominal tenderness. There is no right CVA tenderness, left CVA tenderness, guarding or rebound.  Musculoskeletal:        General: No swelling.     Right lower leg: No edema.     Left lower leg: No edema.  Lymphadenopathy:     Cervical: No cervical adenopathy.  Skin:    General: Skin is warm and dry.  Neurological:     Mental Status: She is alert.      ED Treatments / Results  Labs (all labs ordered are listed, but only abnormal results are displayed) Labs Reviewed  D-DIMER, QUANTITATIVE (NOT AT [facility]) - Abnormal; Notable for the following components:      Result Value   D-Dimer, Quant 2.11 (*)    All other components within normal limits  CBC WITH DIFFERENTIAL/PLATELET  BASIC METABOLIC PANEL  TSH  HCG, QUANTITATIVE, PREGNANCY  TROPONIN I (HIGH SENSITIVITY)  TROPONIN I (HIGH SENSITIVITY)    EKG None  Radiology Dg Chest 2 View  Result Date: 08/22/2019 CLINICAL DATA:  Tachycardia and shortness of breath EXAM: CHEST - 2 VIEW COMPARISON:  None. FINDINGS: The heart size and mediastinal contours are within normal limits. Both lungs are clear. No pleural effusion. The visualized skeletal structures are unremarkable. IMPRESSION: No acute process in the chest. Electronically Signed   By: Macy Mis M.D.   On: 08/22/2019 15:15    Procedures Procedures (including critical care time)  Medications Ordered in ED Medications  iohexol (OMNIPAQUE) 350 MG/ML injection 100 mL (has no  administration in time range)     Initial Impression / Assessment and Plan / ED Course  I have reviewed the triage vital signs and the nursing notes.  Pertinent labs & imaging results that were available during my care of the patient were reviewed by me and considered in my medical decision making (see chart for details).    33 year old female; palpitations intermittently x 2 weeks. Vital signs in the ED stable -  afebrile without tachycardia or tachypnea. EKG with normal sinus rhythm. Pt denies currently having palpitations. She does have nausea and SOB with the palpitations; no chest pain. Pt non smoker. Does have FHx of CAD. Symptoms atypical for ACS. Pt without risk factors of PE but reports mother has hx of blood clots and is unsure if it was unprovoked vs provoked. Will obtain d dimer.   Labwork obtained prior to being seen - CXR clear. CBC without leukocytosis. Hgb stable at 12.1. BMP without electrolyte abnormalities. TSH within normal limits. Initial troponin < 2.0.   D dimer elevated at 2.11; went to update patient on plan for CTA of chest and unfortunately no longer in the room. It appears patient ELOPED and did not make staff aware. I was unable to discuss risks and benefits of leaving prior to CT scan. I have tried to call pt back several times to let her know of findings and to return to the ED immediately but have not been able to get through to her. Voicemail message was left regarding urgency of returning for scan. I did speak with family at another number who reported they would attempt to call her and let her know. Attempted to call family back to see if they were able to get a hold of her but no one is answering at this time.   This note was prepared using Dragon voice recognition software and may include unintentional dictation errors due to the inherent limitations of voice recognition software.       Final Clinical Impressions(s) / ED Diagnoses   Final diagnoses:   Palpitations    ED Discharge Orders    None       Eustaquio Maize, PA-C 08/22/19 1959    Ezequiel Essex, MD 09/01/19 1404

## 2020-02-20 ENCOUNTER — Telehealth: Payer: Self-pay | Admitting: Obstetrics and Gynecology

## 2020-02-20 NOTE — Telephone Encounter (Signed)
Unable to contact patient; patient voicemail box not set up. Was not able to review health screening questions and covid restrictions with patient. Will screen patient upon check in at upcoming appointment.

## 2020-02-21 ENCOUNTER — Encounter: Payer: Self-pay | Admitting: Adult Health

## 2020-02-21 ENCOUNTER — Other Ambulatory Visit (HOSPITAL_COMMUNITY)
Admission: RE | Admit: 2020-02-21 | Discharge: 2020-02-21 | Disposition: A | Discharge status: Home / Self Care | Payer: Managed Care, Other (non HMO) | Source: Ambulatory Visit | Attending: Adult Health | Admitting: Adult Health

## 2020-02-21 ENCOUNTER — Ambulatory Visit (INDEPENDENT_AMBULATORY_CARE_PROVIDER_SITE_OTHER): Payer: Managed Care, Other (non HMO) | Admitting: Adult Health

## 2020-02-21 VITALS — BP 112/74 | HR 75 | Ht 63.0 in | Wt 180.0 lb

## 2020-02-21 DIAGNOSIS — Z8481 Family history of carrier of genetic disease: Secondary | ICD-10-CM | POA: Diagnosis not present

## 2020-02-21 DIAGNOSIS — Z803 Family history of malignant neoplasm of breast: Secondary | ICD-10-CM

## 2020-02-21 DIAGNOSIS — N926 Irregular menstruation, unspecified: Secondary | ICD-10-CM | POA: Insufficient documentation

## 2020-02-21 DIAGNOSIS — Z86711 Personal history of pulmonary embolism: Secondary | ICD-10-CM

## 2020-02-21 DIAGNOSIS — R635 Abnormal weight gain: Secondary | ICD-10-CM | POA: Insufficient documentation

## 2020-02-21 DIAGNOSIS — Z01419 Encounter for gynecological examination (general) (routine) without abnormal findings: Secondary | ICD-10-CM | POA: Diagnosis present

## 2020-02-21 DIAGNOSIS — Z3202 Encounter for pregnancy test, result negative: Secondary | ICD-10-CM | POA: Insufficient documentation

## 2020-02-21 DIAGNOSIS — Z6831 Body mass index (BMI) 31.0-31.9, adult: Secondary | ICD-10-CM

## 2020-02-21 DIAGNOSIS — Z1272 Encounter for screening for malignant neoplasm of vagina: Secondary | ICD-10-CM

## 2020-02-21 LAB — POCT URINE PREGNANCY: Preg Test, Ur: NEGATIVE

## 2020-02-21 NOTE — Progress Notes (Signed)
Patient ID: [name], female   DOB: 07/24/1986, 33 y.o.   MRN: [id number] History of Present Illness:  [name] is a 33 year old Hispanic female, married, [id number], in for a well woman gyn exam and pap. PCP is Dr Golding.  Current Medications, Allergies, Past Medical History, Past Surgical History, Family History and Social History were reviewed in [facility] electronic medical record.     Review of Systems: Patient denies any headaches, hearing loss, fatigue, blurred vision, shortness of breath, chest pain, abdominal pain, problems with bowel movements, urination, or intercourse. No joint pain or mood swings. +weight gain of about 20 lbs since November, is interested in adipex,  Diagnosed with PE in December on eliquis  Periods irregular She is using condoms      Physical Exam:BP 112/74 (BP Location: Left Arm, Patient Position: Sitting, Cuff Size: Normal)   Pulse 75   Ht 5' 3" (1.6 m)   Wt 180 lb (81.6 kg)   LMP 02/05/2020 (Exact Date)   BMI 31.89 kg/m UPT negative General:  Well developed, well nourished, no acute distress Skin:  Warm and dry Neck:  Midline trachea, normal thyroid, good ROM, no lymphadenopathy Lungs; Clear to auscultation bilaterally Breast:  No dominant palpable mass, retraction, or nipple discharge Cardiovascular: Regular rate and rhythm Abdomen:  Soft, non tender, no hepatosplenomegaly Pelvic:  External genitalia is normal in appearance, no lesions.  The vagina is normal in appearance. Urethra has no lesions or masses. The cervix is bulbous.Pap with GC/CHL and high risk HPV 16/18 genotyping performed. Uterus is felt to be normal size, shape, and contour.  No adnexal masses or tenderness noted.Bladder is non tender, no masses felt. Extremities/musculoskeletal:  No swelling or varicosities noted, no clubbing or cyanosis Psych:  No mood changes, alert and cooperative,seems happy AA 4 Fall risk is low PHQ 9 score is 4, no SI or HI, not depressed she  says Examination chaperoned by Sabrina Hall LPN   Impression and Plan: 1. Encounter for gynecological examination with Papanicolaou smear of cervix Pap sent Physical in 1 year  Pap in 3 if normal   2. Urine pregnancy test negative   3. Irregular periods   4. Family history of breast cancer Will get screening mammogram now, sister was 40 Screening mammogram 6/3 at 8:15 at [place]   5. Family history of BRCA1 gene positive She is negative she says   6. History of pulmonary embolus (PE) On eliquis  7. Weight gain Check TSH Will check on adipex with eliquis,        

## 2020-02-22 LAB — TSH: TSH: 1.12 u[IU]/mL (ref 0.450–4.500)

## 2020-02-23 LAB — CYTOLOGY - PAP
Chlamydia: NEGATIVE
Comment: NEGATIVE
Comment: NEGATIVE
Comment: NORMAL
Diagnosis: UNDETERMINED — AB
High risk HPV: NEGATIVE
Neisseria Gonorrhea: NEGATIVE

## 2020-02-27 ENCOUNTER — Telehealth: Payer: Self-pay | Admitting: Adult Health

## 2020-02-27 NOTE — Telephone Encounter (Signed)
Pt aware that TSH normal and repeat pap in 3 years

## 2020-02-29 ENCOUNTER — Ambulatory Visit (HOSPITAL_COMMUNITY): Admission: RE | Admit: 2020-02-29 | Payer: Managed Care, Other (non HMO) | Source: Ambulatory Visit

## 2020-04-18 ENCOUNTER — Ambulatory Visit
Admission: EM | Admit: 2020-04-18 | Discharge: 2020-04-18 | Disposition: A | Discharge status: Home / Self Care | Payer: Managed Care, Other (non HMO)

## 2020-04-18 ENCOUNTER — Other Ambulatory Visit: Payer: Self-pay

## 2020-04-18 NOTE — ED Notes (Signed)
Patient is being discharged from the Urgent Care and sent to the Emergency Department via pov. Per k.avegno, patient is in need of higher level of care due to headache, dizziness and blurred vison. Patient is aware and verbalizes understanding of plan of care.  Vitals:   04/18/20 1742  BP: 118/76  Pulse: 65  Resp: 16  Temp: 99 F (37.2 C)  SpO2: 98%
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
## 2020-05-08 ENCOUNTER — Telehealth: Payer: Self-pay | Admitting: Adult Health

## 2020-05-08 NOTE — Telephone Encounter (Signed)
Patient called stating that she would like a call back from [facility] regarding a medication. Please contact pt

## 2020-05-10 NOTE — Telephone Encounter (Signed)
Left message I called her back. 

## 2020-05-14 ENCOUNTER — Telehealth: Payer: Self-pay | Admitting: Adult Health

## 2020-05-14 NOTE — Telephone Encounter (Signed)
Pt is returning your call from Friday.

## 2020-05-15 NOTE — Telephone Encounter (Signed)
Left message I returned her call  

## 2020-09-10 ENCOUNTER — Other Ambulatory Visit: Payer: Self-pay

## 2020-09-10 ENCOUNTER — Ambulatory Visit (INDEPENDENT_AMBULATORY_CARE_PROVIDER_SITE_OTHER): Payer: Managed Care, Other (non HMO) | Admitting: Adult Health

## 2020-09-10 ENCOUNTER — Other Ambulatory Visit (HOSPITAL_COMMUNITY)
Admission: RE | Admit: 2020-09-10 | Discharge: 2020-09-10 | Disposition: A | Discharge status: Home / Self Care | Payer: Managed Care, Other (non HMO) | Source: Ambulatory Visit | Attending: Adult Health | Admitting: Adult Health

## 2020-09-10 ENCOUNTER — Encounter: Payer: Self-pay | Admitting: Adult Health

## 2020-09-10 VITALS — BP 120/75 | HR 111 | Ht 63.0 in | Wt 177.0 lb

## 2020-09-10 DIAGNOSIS — N930 Postcoital and contact bleeding: Secondary | ICD-10-CM | POA: Insufficient documentation

## 2020-09-10 DIAGNOSIS — Z86711 Personal history of pulmonary embolism: Secondary | ICD-10-CM

## 2020-09-10 DIAGNOSIS — Z803 Family history of malignant neoplasm of breast: Secondary | ICD-10-CM | POA: Diagnosis not present

## 2020-09-10 DIAGNOSIS — N898 Other specified noninflammatory disorders of vagina: Secondary | ICD-10-CM | POA: Insufficient documentation

## 2020-09-10 NOTE — Progress Notes (Signed)
  Subjective:     Patient ID: [name], female   DOB: 12/25/1985, 34 y.o.   MRN: [id number]  HPI Yaslyn is a 34 year old Hispanic female,married, [id number],BZ complaining of vaginal odor after sex and her period and spotting after sex. She has not gotten mammogram yet, highly encouraged to do so. She had PE December 2020 and is now off Eliquis. PCP is Dr Phillips Odor.  Review of Systems +vaginal odor after sex and after her period Spotting after sex Reviewed past medical,surgical, social and family history. Reviewed medications and allergies.     Objective:   Physical Exam BP 120/75 (BP Location: Left Arm, Patient Position: Sitting, Cuff Size: Normal)   Pulse (!) 111   Ht 5\' 3"  (1.6 m)   Wt 177 lb (80.3 kg)   LMP 08/27/2020 (Exact Date)   BMI 31.35 kg/m  Skin warm and dry.Pelvic: external genitalia is normal in appearance no lesions, vagina:+blood with odor,urethra has no lesions or masses noted, cervix:smooth and bulbous, uterus: normal size, shape and contour, non tender, no masses felt, adnexa: no masses or tenderness noted. Bladder is non tender and no masses felt. CV swab obtained.  Upstream - 09/10/20 1358      Pregnancy Intention Screening   Does the patient want to become pregnant in the next year? No    Does the patient's partner want to become pregnant in the next year? No    Would the patient like to discuss contraceptive options today? No      Contraception Wrap Up   Current Method Female Condom    End Method Female Condom    Contraception Counseling Provided No         Examination chaperoned by 09/12/20.    Assessment:     1. Vaginal odor CV swab sent Will talk when results back   2. Postcoital bleeding CV swab sent  3. History of pulmonary embolus (PE)  4. Family history of breast cancer Get mammogram now     Plan:     Follow up prn

## 2020-09-11 ENCOUNTER — Telehealth: Payer: Self-pay | Admitting: General Practice

## 2020-09-11 NOTE — Telephone Encounter (Signed)
Patient states she received her COVID vaccine no further information was given.  

## 2020-09-12 ENCOUNTER — Other Ambulatory Visit: Payer: Self-pay | Admitting: Adult Health

## 2020-09-12 LAB — CERVICOVAGINAL ANCILLARY ONLY
Bacterial Vaginitis (gardnerella): POSITIVE — AB
Candida Glabrata: NEGATIVE
Candida Vaginitis: NEGATIVE
Chlamydia: NEGATIVE
Comment: NEGATIVE
Comment: NEGATIVE
Comment: NEGATIVE
Comment: NEGATIVE
Comment: NEGATIVE
Comment: NORMAL
Neisseria Gonorrhea: NEGATIVE
Trichomonas: NEGATIVE

## 2020-09-12 MED ORDER — METRONIDAZOLE 500 MG PO TABS: 500.0000 mg | ORAL_TABLET | Freq: Two times a day (BID) | ORAL | 0 refills | Status: DC

## 2020-09-12 NOTE — Progress Notes (Signed)
+  BV on vaginal swab will rx flagyl 

## 2020-11-13 ENCOUNTER — Telehealth: Payer: Self-pay | Admitting: Adult Health

## 2020-11-13 NOTE — Telephone Encounter (Signed)
Patient wants to discuss pelvic pressure before scheduled 11/20/20 appointment. Clinical staff will follow up with patient.

## 2020-11-13 NOTE — Telephone Encounter (Signed)
Pt feels like something is protruding from vagina. Noticed this last week. Pt was advised to avoid heavy lifting and straining. Pt is not having any pain or bleeding. Pt is ok to walk for exercise. Pt was advised no sex until after she is seen. She sometimes has to strain to have a BM. Pt was advised can take Colace to keep BM's soft. Pt voiced understanding. Keep appt for 2/23.  JSY

## 2020-11-20 ENCOUNTER — Other Ambulatory Visit: Payer: Self-pay

## 2020-11-20 ENCOUNTER — Other Ambulatory Visit (HOSPITAL_COMMUNITY)
Admission: RE | Admit: 2020-11-20 | Discharge: 2020-11-20 | Disposition: A | Discharge status: Home / Self Care | Payer: Managed Care, Other (non HMO) | Source: Ambulatory Visit | Attending: Adult Health | Admitting: Adult Health

## 2020-11-20 ENCOUNTER — Ambulatory Visit (INDEPENDENT_AMBULATORY_CARE_PROVIDER_SITE_OTHER): Payer: Managed Care, Other (non HMO) | Admitting: Adult Health

## 2020-11-20 ENCOUNTER — Encounter: Payer: Self-pay | Admitting: Adult Health

## 2020-11-20 VITALS — BP 113/70 | HR 85 | Ht 63.0 in | Wt 165.0 lb

## 2020-11-20 DIAGNOSIS — N816 Rectocele: Secondary | ICD-10-CM | POA: Insufficient documentation

## 2020-11-20 DIAGNOSIS — N8189 Other female genital prolapse: Secondary | ICD-10-CM

## 2020-11-20 DIAGNOSIS — N898 Other specified noninflammatory disorders of vagina: Secondary | ICD-10-CM | POA: Insufficient documentation

## 2020-11-20 NOTE — Addendum Note (Signed)
Addended by: Colen Darling on: 11/20/2020 01:36 PM   Modules accepted: Orders

## 2020-11-20 NOTE — Progress Notes (Addendum)
  Subjective:     Patient ID: [name], female   DOB: Oct 11, 1985, 35 y.o.   MRN: [id number]  HPI [name] is a 35 year old white female,married, [id number] in complaining of pelvic pressure started 2 weeks ago when dead lifting 90 lbs and has vaginal discharge with some itching. She thinks she felt her cervix. PCP is Dr Phillips Odor.  Review of Systems +pelvic pressure +vaginal discharge with some itching  No sex in over 3 weeks  Reviewed past medical,surgical, social and family history. Reviewed medications and allergies.     Objective:   Physical Exam BP 113/70 (BP Location: Right Arm, Patient Position: Sitting, Cuff Size: Normal)   Pulse 85   Ht 5\' 3"  (1.6 m)   Wt 165 lb (74.8 kg)   LMP 11/10/2020   BMI 29.23 kg/m   Skin warm and dry.Pelvic: external genitalia is normal in appearance no lesions, vagina: white,mucous discharge without odor,urethra has no lesions or masses noted, cervix:smooth and bulbous,does have some pelvic relaxation when bears down, uterus: normal size, shape and contour, non tender, no masses felt, adnexa: no masses or tenderness noted. Bladder is non tender and no masses felt. On rectal exam, no masses felt and has +rectocele. CV swab obtained. Fall risk is low  Upstream - 11/20/20 1215      Pregnancy Intention Screening   Does the patient want to become pregnant in the next year? No    Does the patient's partner want to become pregnant in the next year? No    Would the patient like to discuss contraceptive options today? No      Contraception Wrap Up   Current Method Female Condom    End Method Female Condom    Contraception Counseling Provided No            Examination chaperoned by 11/22/20 LPN  Assessment:     1. Pelvic relaxation Stop lifting for now, can do hand weights but not dead lifts Reviewed pelvic relaxation and rectocele with her with Medical Explainer #4  2. Rectocele   3. Vaginal discharge Cv swab sent for GC/CHL,trich,BV and yeast      Plan:      Return for physical about 02/26/21, or sooner of needed

## 2020-11-22 LAB — CERVICOVAGINAL ANCILLARY ONLY
Bacterial Vaginitis (gardnerella): NEGATIVE
Candida Glabrata: NEGATIVE
Candida Vaginitis: POSITIVE — AB
Chlamydia: NEGATIVE
Comment: NEGATIVE
Comment: NEGATIVE
Comment: NEGATIVE
Comment: NEGATIVE
Comment: NEGATIVE
Comment: NORMAL
Neisseria Gonorrhea: NEGATIVE
Trichomonas: NEGATIVE

## 2020-11-25 ENCOUNTER — Telehealth: Payer: Self-pay | Admitting: Adult Health

## 2020-11-25 MED ORDER — FLUCONAZOLE 150 MG PO TABS: ORAL_TABLET | ORAL | 1 refills | Status: DC

## 2020-11-25 NOTE — Telephone Encounter (Signed)
Pt calling to check on recent lab results  Please advise & call pt

## 2020-11-25 NOTE — Telephone Encounter (Signed)
Pt aware +yeast on CV swab rx sent for diflucan

## 2021-02-27 ENCOUNTER — Other Ambulatory Visit: Payer: Managed Care, Other (non HMO) | Admitting: Adult Health

## 2021-11-14 ENCOUNTER — Encounter (HOSPITAL_COMMUNITY): Payer: Self-pay | Admitting: Hematology

## 2021-11-14 ENCOUNTER — Inpatient Hospital Stay (HOSPITAL_COMMUNITY): Payer: Managed Care, Other (non HMO) | Attending: Hematology | Admitting: Hematology

## 2021-11-14 ENCOUNTER — Other Ambulatory Visit: Payer: Self-pay

## 2021-11-14 ENCOUNTER — Inpatient Hospital Stay (HOSPITAL_COMMUNITY): Payer: Managed Care, Other (non HMO)

## 2021-11-14 VITALS — BP 114/63 | HR 64 | Temp 98.8°F | Resp 18 | Ht 63.0 in | Wt 178.8 lb

## 2021-11-14 DIAGNOSIS — Z800 Family history of malignant neoplasm of digestive organs: Secondary | ICD-10-CM | POA: Diagnosis not present

## 2021-11-14 DIAGNOSIS — Z803 Family history of malignant neoplasm of breast: Secondary | ICD-10-CM | POA: Diagnosis not present

## 2021-11-14 DIAGNOSIS — Z86711 Personal history of pulmonary embolism: Secondary | ICD-10-CM | POA: Diagnosis not present

## 2021-11-14 LAB — D-DIMER, QUANTITATIVE: D-Dimer, Quant: 0.51 ug/mL-FEU — ABNORMAL HIGH (ref 0.00–0.50)

## 2021-11-14 LAB — ANTITHROMBIN III: AntiThromb III Func: 104 % (ref 75–120)

## 2021-11-14 NOTE — Patient Instructions (Addendum)
[facility] Discharge Instructions  You were seen and examined today by Dr. Ellin Saba. Dr. Ellin Saba is a hematologist, meaning that he specializes in blood abnormalities. Dr. Ellin Saba discussed your past medical history, family history of cancers/blood conditions and the events that led to you being here today.  You were referred to Dr. Ellin Saba due to your previous DVT (blood clot). Dr. Ellin Saba has recommended lab work today to see if there is anything that would place you at higher risk for developing blood clots.  Please reach out to your PCP, Terie Purser to begin mammograms given your family history of breast cancer.  Follow-up as scheduled.   Thank you for choosing [facility] at [facility] to provide your oncology and hematology care.  To afford each patient quality time with our provider, please arrive at least 15 minutes before your scheduled appointment time.   If you have a lab appointment with the Cancer Center please come in thru the Main Entrance and check in at the main information desk.  You need to re-schedule your appointment should you arrive 10 or more minutes late.  We strive to give you quality time with our providers, and arriving late affects you and other patients whose appointments are after yours.  Also, if you no show three or more times for appointments you may be dismissed from the clinic at the providers discretion.     Again, thank you for choosing [facility].  Our hope is that these requests will decrease the amount of time that you wait before being seen by our physicians.       _____________________________________________________________  Should you have questions after your visit to [facility], please contact our office at [phone] and follow the prompts.  Our office hours are 8:00 a.m. and 4:30 p.m. Monday - Friday.  Please note that voicemails left  after 4:00 p.m. may not be returned until the following business day.  We are closed weekends and major holidays.  You do have access to a nurse 24-7, just call the main number to the clinic [phone] and do not press any options, hold on the line and a nurse will answer the phone.    For prescription refill requests, have your pharmacy contact our office and allow 72 hours.    Due to Covid, you will need to wear a mask upon entering the hospital. If you do not have a mask, a mask will be given to you at the Main Entrance upon arrival. For doctor visits, patients may have 1 support person age 65 or older with them. For treatment visits, patients can not have anyone with them due to social distancing guidelines and our immunocompromised population.

## 2021-11-14 NOTE — Progress Notes (Signed)
[place], [place] [address]   CLINIC:  Medical Oncology/Hematology  Patient Care Team: Sharilyn Sites, MD as PCP - General (Family Medicine) Derek Jack, MD as Medical Oncologist (Hematology)  CHIEF COMPLAINTS/PURPOSE OF CONSULTATION:  Evaluation of history of PE  HISTORY OF PRESENTING ILLNESS:  MIKINZIE Dillon 36 y.o. female is here because of history PE, at the request of Delman Cheadle, NP.  Today she reports feeling good. She reports previous PE 2 years ago in [place] at which time she was place on a Eliquis for 6 months. She presented to the ED at that time due to palpitations. Prior to the PE she was on a XULANE patch; she denies being immobilized prior to PE. 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 She denies history of thrombocytopenia and anemia. She denies fevers, night sweats, skin rashes, joint pains or swellings, and weight loss.  She currently works in Therapist, art. She denies smoking history. Her mother has a history of DVT and PE. Her mother and sister had breast cancer; her mother was 47, and her sister was 23 at the time of their diagnoses. Her maternal grandmother had stomach cancer. Her sister has a history of miscarriages. She denies family history of of lupus anticoagulant.   MEDICAL HISTORY:  Past Medical History:  Diagnosis Date   Family history of BRCA1 gene positive    Family history of breast cancer    History of pulmonary embolus (PE)    Medical history non-contributory     SURGICAL HISTORY: Past Surgical History:  Procedure Laterality Date   NO PAST SURGERIES     REMOVAL OF NON VAGINAL CONTRACEPTIVE DEVICE Right 06/07/2014   Procedure: REMOVAL OF DEEP IMPLANON IMPLANT, ULTRASOUND GUIDED, RIGHT UPPER ARM;  Surgeon: Jonnie Kind, MD;  Location: [facility];  Service: Gynecology;  Laterality: Right;    SOCIAL HISTORY: Social History   Socioeconomic History    Marital status: Married    Spouse name: Not on file   Number of children: Not on file   Years of education: Not on file   Highest education level: Not on file  Occupational History   Not on file  Tobacco Use   Smoking status: Never   Smokeless tobacco: Never  Vaping Use   Vaping Use: Never used  Substance and Sexual Activity   Alcohol use: No   Drug use: No   Sexual activity: Yes    Birth control/protection: None, Condom  Other Topics Concern   Not on file  Social History Narrative   Not on file   Social Determinants of Health   Financial Resource Strain: Not on file  Food Insecurity: Not on file  Transportation Needs: Not on file  Physical Activity: Not on file  Stress: Not on file  Social Connections: Not on file  Intimate Partner Violence: Not on file    FAMILY HISTORY: Family History  Problem Relation Age of Onset   Diabetes Mother    Hypertension Mother    Breast cancer Mother 29   Stomach cancer Maternal Grandfather    Cancer Sister        breast   Breast cancer Sister    Breast cancer Maternal Aunt 60   Stomach cancer Maternal Aunt     ALLERGIES:  has No Known Allergies.  MEDICATIONS:  No current outpatient medications on file.   No current facility-administered medications for this visit.    REVIEW OF  SYSTEMS:   Review of Systems  Constitutional:  Negative for appetite change, fatigue, fever and unexpected weight change.  Respiratory:  Positive for shortness of breath.   Cardiovascular:  Positive for palpitations.  Endocrine: Negative for hot flashes.  Musculoskeletal:  Negative for arthralgias.  Skin:  Negative for rash.  Neurological:  Positive for dizziness.  Psychiatric/Behavioral:  Positive for sleep disturbance. The patient is nervous/anxious.   All other systems reviewed and are negative.   PHYSICAL EXAMINATION: ECOG PERFORMANCE STATUS: 0 - Asymptomatic  Vitals:   11/14/21 0825  BP: 114/63  Pulse: 64  Resp: 18  Temp: 98.8 F  (37.1 C)  SpO2: 100%   Filed Weights   11/14/21 0825  Weight: 178 lb 12.7 oz (81.1 kg)   Physical Exam Vitals reviewed.  Constitutional:      Appearance: Normal appearance. She is obese.  Cardiovascular:     Rate and Rhythm: Normal rate and regular rhythm.     Pulses: Normal pulses.     Heart sounds: Normal heart sounds.  Pulmonary:     Effort: Pulmonary effort is normal.     Breath sounds: Normal breath sounds.  Abdominal:     Palpations: Abdomen is soft. There is no hepatomegaly, splenomegaly or mass.     Tenderness: There is no abdominal tenderness.  Lymphadenopathy:     Cervical: No cervical adenopathy.     Right cervical: No superficial cervical adenopathy.    Left cervical: No superficial cervical adenopathy.     Upper Body:     Right upper body: No supraclavicular, axillary or pectoral adenopathy.     Left upper body: No supraclavicular, axillary or pectoral adenopathy.     Lower Body: No right inguinal adenopathy. No left inguinal adenopathy.  Neurological:     General: No focal deficit present.     Mental Status: She is alert and oriented to person, place, and time.  Psychiatric:        Mood and Affect: Mood normal.        Behavior: Behavior normal.     LABORATORY DATA:  I have reviewed the data as listed Recent Results (from the past 2160 hour(s))  D-dimer, quantitative     Status: Abnormal   Collection Time: 11/14/21  8:55 AM  Result Value Ref Range   D-Dimer, Quant 0.51 (H) 0.00 - 0.50 ug/mL-FEU    Comment: (NOTE) At the manufacturer cut-off value of 0.5 g/mL FEU, this assay has a negative predictive value of 95-100%.This assay is intended for use in conjunction with a clinical pretest probability (PTP) assessment model to exclude pulmonary embolism (PE) and deep venous thrombosis (DVT) in outpatients suspected of PE or DVT. Results should be correlated with clinical presentation. Performed at [facility], [address], [place], [place] [address]      RADIOGRAPHIC STUDIES: I have personally reviewed the radiological images as listed and agreed with the findings in the report. No results found.  ASSESSMENT:  History of weakly provoked PE: - Patient seen at the request of Delman Cheadle, PA-C - Patient reported being diagnosed with pulmonary embolism on CT scan done in [place] about 2 to 3 years ago when she presented with palpitations. - She was on birth control patch at that time. - She was prescribed 6 months of Eliquis, which she tolerated well.  She has not been on any birth control since then. 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   Social/family history: - She works in  customer service.  She is a non-smoker. - Mother had recurrent DVT and PE and is on lifelong anticoagulation. - Mother had breast cancer at age 57. - Sister had breast cancer at age 80.  Sister also had miscarriages. - Maternal grandmother had stomach cancer.   PLAN:  History of weakly provoked PE: - She had weakly provoked pulmonary embolism in the setting of contraceptive patch. - Agree with discontinuing hormonal contraception. - History of 4 miscarriages, highly suggestive of antiphospholipid syndrome. - We will check lupus anticoagulant, anticardiolipin antibody, antibeta 2 glycoprotein 1 antibody and a D-dimer. - Given family history, we will also check other hypercoagulable states. - If she has positive lupus anticoagulant, it will be repeated in 12 months.  If she has consistent positive lupus anticoagulant, she will be offered indefinite anticoagulation.  2.  Family history of breast cancer: - Patient reports that she was tested for BRCA 1 and 2 and was negative. - Given strong family history, I have recommended screening mammograms/MRI starting immediately.   All questions were answered. The patient knows to call the clinic with any problems, questions or concerns.  Derek Jack, MD 11/14/21 10:37 AM   [place] [phone]   I, Thana Ates, am acting as a scribe for Dr. Derek Jack.  I, Derek Jack MD, have reviewed the above documentation for accuracy and completeness, and I agree with the above.

## 2021-11-15 LAB — PROTEIN C ACTIVITY: Protein C Activity: 102 % (ref 73–180)

## 2021-11-15 LAB — LUPUS ANTICOAGULANT PANEL
DRVVT: 35.4 s (ref 0.0–47.0)
PTT Lupus Anticoagulant: 34.3 s (ref 0.0–43.5)

## 2021-11-15 LAB — BETA-2-GLYCOPROTEIN I ABS, IGG/M/A
Beta-2 Glyco I IgG: <9 GPI IgG units (ref 0–20)
Beta-2-Glycoprotein I IgA: <9 GPI IgA units (ref 0–25)
Beta-2-Glycoprotein I IgM: <9 GPI IgM units (ref 0–32)

## 2021-11-15 LAB — PROTEIN S ACTIVITY: Protein S Activity: 78 % (ref 63–140)

## 2021-11-15 LAB — CARDIOLIPIN ANTIBODIES, IGG, IGM, IGA
Anticardiolipin IgA: <9 APL U/mL (ref 0–11)
Anticardiolipin IgG: <9 GPL U/mL (ref 0–14)
Anticardiolipin IgM: <9 MPL U/mL (ref 0–12)

## 2021-11-15 LAB — PROTEIN S, TOTAL: Protein S Ag, Total: 62 % (ref 60–150)

## 2021-11-16 LAB — PROTEIN C, TOTAL: Protein C, Total: 81 % (ref 60–150)

## 2021-11-18 LAB — FACTOR 5 LEIDEN

## 2021-11-20 LAB — PROTHROMBIN GENE MUTATION

## 2021-11-27 ENCOUNTER — Other Ambulatory Visit (HOSPITAL_COMMUNITY): Payer: Self-pay | Admitting: Family Medicine

## 2021-11-27 DIAGNOSIS — Z1231 Encounter for screening mammogram for malignant neoplasm of breast: Secondary | ICD-10-CM

## 2021-12-01 ENCOUNTER — Ambulatory Visit (HOSPITAL_COMMUNITY): Payer: Managed Care, Other (non HMO) | Admitting: Physician Assistant

## 2021-12-02 NOTE — Progress Notes (Addendum)
[place], [place] [address]   CLINIC:  Medical Oncology/Hematology  PCP:  Sharilyn Sites, [place] [address] [phone]   REASON FOR VISIT:  Follow-up for history of pulmonary embolism  PRIOR THERAPY: Eliquis  CURRENT THERAPY: None  INTERVAL HISTORY:  [name] 36 y.o. female returns for routine follow-up of her history of pulmonary embolism.  She was seen for initial consultation by Dr. Delton Coombes on 11/14/2021.  At today's visit, she reports feeling well. She has not had any changes in her health status since her visit with Dr. Delton Coombes 3 weeks ago. She denies any current signs or symptoms of DVT or PE. She denies any unilateral leg swelling, pain, and erythema.  She has not had any recent shortness of breath, dyspnea on exertion, chest pain, cough, hemoptysis, and palpitations.  She has 75% energy and 70% appetite. She endorses that she is maintaining a stable weight.   REVIEW OF SYSTEMS:  Review of Systems  Constitutional:  Negative for appetite change, chills, diaphoresis, fatigue, fever and unexpected weight change.  HENT:   Negative for lump/mass and nosebleeds.   Eyes:  Negative for eye problems.  Respiratory:  Negative for cough, hemoptysis and shortness of breath.   Cardiovascular:  Negative for chest pain, leg swelling and palpitations.  Gastrointestinal:  Negative for abdominal pain, blood in stool, constipation, diarrhea, nausea and vomiting.  Genitourinary:  Negative for hematuria.   Skin: Negative.   Neurological:  Negative for dizziness, headaches and light-headedness.  Hematological:  Does not bruise/bleed easily.     PAST MEDICAL/SURGICAL HISTORY:  Past Medical History:  Diagnosis Date   Family history of BRCA1 gene positive    Family history of breast cancer    History of pulmonary embolus (PE)    Medical history non-contributory    Past Surgical History:  Procedure  Laterality Date   NO PAST SURGERIES     REMOVAL OF NON VAGINAL CONTRACEPTIVE DEVICE Right 06/07/2014   Procedure: REMOVAL OF DEEP IMPLANON IMPLANT, ULTRASOUND GUIDED, RIGHT UPPER ARM;  Surgeon: Jonnie Kind, MD;  Location: [facility];  Service: Gynecology;  Laterality: Right;     SOCIAL HISTORY:  Social History   Socioeconomic History   Marital status: Married    Spouse name: Not on file   Number of children: Not on file   Years of education: Not on file   Highest education level: Not on file  Occupational History   Not on file  Tobacco Use   Smoking status: Never   Smokeless tobacco: Never  Vaping Use   Vaping Use: Never used  Substance and Sexual Activity   Alcohol use: No   Drug use: No   Sexual activity: Yes    Birth control/protection: None, Condom  Other Topics Concern   Not on file  Social History Narrative   Not on file   Social Determinants of Health   Financial Resource Strain: Not on file  Food Insecurity: Not on file  Transportation Needs: Not on file  Physical Activity: Not on file  Stress: Not on file  Social Connections: Not on file  Intimate Partner Violence: Not on file    FAMILY HISTORY:  Family History  Problem Relation Age of Onset   Diabetes Mother    Hypertension Mother    Breast cancer Mother 65   Stomach cancer Maternal Grandfather    Cancer Sister        breast   Breast cancer  Sister    Breast cancer Maternal Aunt 60   Stomach cancer Maternal Aunt     CURRENT MEDICATIONS:  No outpatient encounter medications on file as of 12/03/2021.   No facility-administered encounter medications on file as of 12/03/2021.    ALLERGIES:  No Known Allergies   PHYSICAL EXAM:  ECOG PERFORMANCE STATUS: 0 - Asymptomatic  There were no vitals filed for this visit. There were no vitals filed for this visit. Physical Exam Constitutional:      Appearance: Normal appearance.  HENT:     Head: Normocephalic and atraumatic.     Mouth/Throat:      Mouth: Mucous membranes are moist.  Eyes:     Extraocular Movements: Extraocular movements intact.     Pupils: Pupils are equal, round, and reactive to light.  Cardiovascular:     Rate and Rhythm: Normal rate and regular rhythm.     Pulses: Normal pulses.     Heart sounds: Normal heart sounds.  Pulmonary:     Effort: Pulmonary effort is normal.     Breath sounds: Normal breath sounds.  Abdominal:     General: Bowel sounds are normal.     Palpations: Abdomen is soft.     Tenderness: There is no abdominal tenderness.  Musculoskeletal:        General: No swelling.     Right lower leg: No edema.     Left lower leg: No edema.  Lymphadenopathy:     Cervical: No cervical adenopathy.  Skin:    General: Skin is warm and dry.  Neurological:     General: No focal deficit present.     Mental Status: She is alert and oriented to person, place, and time.  Psychiatric:        Mood and Affect: Mood normal.        Behavior: Behavior normal.     LABORATORY DATA:  I have reviewed the labs as listed.  CBC    Component Value Date/Time   WBC 5.4 08/22/2019 1529   RBC 4.09 08/22/2019 1529   HGB 12.1 08/22/2019 1529   HCT 38.9 08/22/2019 1529   PLT 293 08/22/2019 1529   MCV 95.1 08/22/2019 1529   MCH 29.6 08/22/2019 1529   MCHC 31.1 08/22/2019 1529   RDW 12.5 08/22/2019 1529   LYMPHSABS 2.3 08/22/2019 1529   MONOABS 0.5 08/22/2019 1529   EOSABS 0.1 08/22/2019 1529   BASOSABS 0.0 08/22/2019 1529   CMP Latest Ref Rng & Units 08/22/2019 06/01/2014 12/27/2008  Glucose 70 - 99 mg/dL 82 91 110(H)  BUN 6 - 20 mg/dL _0 Creatinine 0.44 - 1.00 mg/dL 0.61 0.57 0.65  Sodium 135 - 145 mmol/L 135 139 138  Potassium 3.5 - 5.1 mmol/L 3.8 4.3 3.6  Chloride 98 - 111 mmol/L 103 103 105  CO2 22 - 32 mmol/L _1 Calcium 8.9 - 10.3 mg/dL 9.0 9.4 9.8  Total Protein 6.0 - 8.3 g/dL - - 7.3  Total Bilirubin 0.3 - 1.2 mg/dL - - 0.6  Alkaline Phos 39 - 117 U/L - - 77  AST 0 - 37 U/L - - 24  ALT 0  - 35 U/L - - 15    DIAGNOSTIC IMAGING:  I have independently reviewed the relevant imaging and discussed with the patient.   ASSESSMENT & PLAN: 1.  History of weakly provoked PE: - Patient seen at the request of Delman Cheadle, PA-C - Patient reported being diagnosed with pulmonary embolism on  CT scan done in [place] about 2 to 3 years ago when she presented with palpitations. - She was prescribed 6 months of Eliquis, which she tolerated well.   - PE was weakly provoked in the setting of birth control patch Marilu Favre) at that time.  No immobilization prior to PE.  She has not been on any hormonal birth control since then. 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  Her sister also had miscarriages. - Patient's mother had recurrent DVT and PE and is on lifelong anticoagulation. - Coagulopathy work-up negative.  (NEGATIVE lupus anticoagulant panel, cardiolipin antibodies, beta-2 glycoprotein 1 antibodies, factor V Leiden, prothrombin gene mutation, protein C, protein S, and Antithrombin III). - Clinical picture was suggestive of familial hypercoagulable disorders, such as antiphospholipid syndrome.  However, coagulopathy work-up was unremarkable. - D-dimer marginally elevated at 0.51, but no clinical signs/symptoms of DVT or PE at this time.   - PLAN: There is no indication for lifelong anticoagulation at this time, but patient is aware that if she has any DVT or PE in the future she will need to return to our clinic and would likely need indefinite anticoagulation with any recurrent VTE. - Due to mildly elevated D-dimer, we will recheck D-dimer and see patient for follow-up in 4 months.  If D-dimer has normalized at that time, will tentatively discharge patient from clinic. - Patient has been educated on signs/symptoms that would prompt immediate medical attention.   - Agree with discontinuing hormonal contraception.  2.  Family history of breast cancer: - Mother had  breast cancer at age 55. - Sister had breast cancer at age 4. - Patient's mother was found to have BRCA1 mutation - Patient reports that she was tested for BRCA 1 and 2 and was negative. - PLAN: Given strong family history, we have recommended screening mammogram/MRI starting immediately.  She will follow-up with her PCP for this.  3.  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 - Mother had recurrent DVT and PE and is on lifelong anticoagulation. - Mother had breast cancer at age 26. - Sister had breast cancer at age 4.  Sister also had miscarriages. - Maternal grandmother had stomach cancer.   PLAN SUMMARY & DISPOSITION: Labs and phone visit in 4 months  All questions were answered. The patient knows to call the clinic with any problems, questions or concerns.  Medical decision making: Moderate  Time spent on visit: I spent 20 minutes counseling the patient face to face. The total time spent in the appointment was 30 minutes and more than 50% was on counseling.   Harriett Rush, PA-C  12/03/21 8:52 AM

## 2021-12-03 ENCOUNTER — Other Ambulatory Visit: Payer: Self-pay

## 2021-12-03 ENCOUNTER — Inpatient Hospital Stay (HOSPITAL_COMMUNITY): Payer: Managed Care, Other (non HMO) | Attending: Hematology | Admitting: Physician Assistant

## 2021-12-03 VITALS — BP 101/69 | HR 57 | Temp 97.3°F | Resp 18 | Ht 62.99 in | Wt 179.9 lb

## 2021-12-03 DIAGNOSIS — R7989 Other specified abnormal findings of blood chemistry: Secondary | ICD-10-CM | POA: Insufficient documentation

## 2021-12-03 DIAGNOSIS — Z803 Family history of malignant neoplasm of breast: Secondary | ICD-10-CM | POA: Diagnosis not present

## 2021-12-03 DIAGNOSIS — Z800 Family history of malignant neoplasm of digestive organs: Secondary | ICD-10-CM | POA: Diagnosis not present

## 2021-12-03 DIAGNOSIS — Z86711 Personal history of pulmonary embolism: Secondary | ICD-10-CM | POA: Diagnosis not present

## 2021-12-03 NOTE — Patient Instructions (Signed)
[facility] at [facility] ?Discharge Instructions ? ?You were seen today by Rojelio Brenner PA-C for your history of blood clots in your lungs (pulmonary embolism).  Your blood tests did not show any underlying blood-clotting disorders that would have caused this blood clot, so you do NOT need to go back onto any blood thinner medications right now.  However, it is VERY IMPORTANT that you know the symptoms of a blood clot in case it happens again.  Please read the attached handouts and seek IMMEDIATE medical attention if you have any sudden chest pain, difficulty breathing, racing heart, or one-sided leg swelling. ? ?We will repeat a few simple labs on you in 4 months, and if those labs are normal, we will discharge you from our clinic at that time.  However, if you have any blood clots again in the future, you will need to come back and see [place] again to discuss possible lifelong blood thinners. ? ?Do not take any hormonal birth control, since this may have been what caused your blood clot 2 years ago. ? ?LABS: Return in 4 months for repeat labs  ? ?FOLLOW-UP APPOINTMENT: Phone visit after labs ? ? ?Thank you for choosing [facility] at [facility] to provide your oncology and hematology care.  To afford each patient quality time with our provider, please arrive at least 15 minutes before your scheduled appointment time.  ? ?If you have a lab appointment with the Cancer Center please come in thru the Main Entrance and check in at the main information desk. ? ?You need to re-schedule your appointment should you arrive 10 or more minutes late.  We strive to give you quality time with our providers, and arriving late affects you and other patients whose appointments are after yours.  Also, if you no show three or more times for appointments you may be dismissed from the clinic at the providers discretion.     ?Again, thank you for choosing [facility].  Our  hope is that these requests will decrease the amount of time that you wait before being seen by our physicians.       ?_____________________________________________________________ ? ?Should you have questions after your visit to [facility], please contact our office at [phone] and follow the prompts.  Our office hours are 8:00 a.m. and 4:30 p.m. Monday - Friday.  Please note that voicemails left after 4:00 p.m. may not be returned until the following business day.  We are closed weekends and major holidays.  You do have access to a nurse 24-7, just call the main number to the clinic [phone] and do not press any options, hold on the line and a nurse will answer the phone.   ? ?For prescription refill requests, have your pharmacy contact our office and allow 72 hours.   ? ?Due to Covid, you will need to wear a mask upon entering the hospital. If you do not have a mask, a mask will be given to you at the Main Entrance upon arrival. For doctor visits, patients may have 1 support person age 13 or older with them. For treatment visits, patients can not have anyone with them due to social distancing guidelines and our immunocompromised population.  ? ? ? ?

## 2021-12-10 ENCOUNTER — Ambulatory Visit (HOSPITAL_COMMUNITY)
Admission: RE | Admit: 2021-12-10 | Discharge: 2021-12-10 | Disposition: A | Discharge status: Home / Self Care | Payer: Managed Care, Other (non HMO) | Source: Ambulatory Visit | Attending: Family Medicine | Admitting: Family Medicine

## 2021-12-10 ENCOUNTER — Encounter (HOSPITAL_COMMUNITY): Payer: Self-pay

## 2021-12-10 ENCOUNTER — Other Ambulatory Visit: Payer: Self-pay

## 2021-12-10 DIAGNOSIS — Z1231 Encounter for screening mammogram for malignant neoplasm of breast: Secondary | ICD-10-CM | POA: Diagnosis present

## 2021-12-11 ENCOUNTER — Other Ambulatory Visit (HOSPITAL_COMMUNITY): Payer: Self-pay | Admitting: Family Medicine

## 2022-01-13 ENCOUNTER — Ambulatory Visit (HOSPITAL_COMMUNITY)
Admission: RE | Admit: 2022-01-13 | Discharge: 2022-01-13 | Disposition: A | Discharge status: Home / Self Care | Payer: Managed Care, Other (non HMO) | Source: Ambulatory Visit | Attending: Family Medicine | Admitting: Family Medicine

## 2022-01-13 DIAGNOSIS — R928 Other abnormal and inconclusive findings on diagnostic imaging of breast: Secondary | ICD-10-CM | POA: Diagnosis present

## 2022-04-06 ENCOUNTER — Inpatient Hospital Stay (HOSPITAL_COMMUNITY): Payer: Managed Care, Other (non HMO) | Attending: Hematology

## 2022-04-06 DIAGNOSIS — Z1509 Genetic susceptibility to other malignant neoplasm: Secondary | ICD-10-CM | POA: Diagnosis not present

## 2022-04-06 DIAGNOSIS — Z7901 Long term (current) use of anticoagulants: Secondary | ICD-10-CM | POA: Diagnosis not present

## 2022-04-06 DIAGNOSIS — Z1501 Genetic susceptibility to malignant neoplasm of breast: Secondary | ICD-10-CM | POA: Diagnosis not present

## 2022-04-06 DIAGNOSIS — Z803 Family history of malignant neoplasm of breast: Secondary | ICD-10-CM | POA: Diagnosis not present

## 2022-04-06 DIAGNOSIS — Z86711 Personal history of pulmonary embolism: Secondary | ICD-10-CM

## 2022-04-06 LAB — D-DIMER, QUANTITATIVE: D-Dimer, Quant: 0.38 ug/mL-FEU (ref 0.00–0.50)

## 2022-04-12 NOTE — Progress Notes (Unsigned)
Virtual Visit via Telephone Note [facility]  I connected with Dahlia Byes  on 04/13/2022 at 2:38 PM by telephone and verified that I am speaking with the correct person using two identifiers.  Location: Patient: Home Provider: [facility]   I discussed the limitations, risks, security and privacy concerns of performing an evaluation and management service by telephone and the availability of in person appointments. I also discussed with the patient that there may be a patient responsible charge related to this service. The patient expressed understanding and agreed to proceed.  REASON FOR VISIT:  Follow-up for history of pulmonary embolism   PRIOR THERAPY: Eliquis   CURRENT THERAPY: None  INTERVAL HISTORY: Ms. SHAUNTELL IGLESIA is contacted today for follow-up of her history of pulmonary embolism.  She was last seen by Tarri Abernethy PA-C on 12/03/2021.  At today's visit, she reports feeling well.  She denies any changes in her health status or new diagnoses since her last visit.  She denies any current symptoms of DVT or PE.  She denies any unilateral leg swelling, pain, or erythema.  She has not had any recent shortness of breath, dyspnea on exertion, chest pain, cough, hemoptysis, or palpitations. She is not taking any hormonal birth control.  She reports 80% energy and 100% appetite.  She reports that she is maintaining a stable weight.    OBSERVATIONS/OBJECTIVE: Patient has no complaints at the time of today's visit. Review of Systems  Constitutional:  Negative for chills, diaphoresis, fever, malaise/fatigue and weight loss.  Respiratory:  Negative for cough and shortness of breath.   Cardiovascular:  Negative for chest pain and palpitations.  Gastrointestinal:  Negative for abdominal pain, blood in stool, melena, nausea and vomiting.  Neurological:  Negative for dizziness and headaches.     PHYSICAL EXAM (per limitations of virtual telephone  visit): The patient is alert and oriented x 3, exhibiting adequate mentation, good mood, and ability to speak in full sentences and execute sound judgement.   ASSESSMENT & PLAN: 1.  History of weakly provoked PE: - Patient seen at the request of Delman Cheadle, PA-C - Patient reported being diagnosed with pulmonary embolism on CT scan done in [place] about 2 to 3 years ago when she presented with palpitations. - She was prescribed 6 months of Eliquis, which she tolerated well.   - PE was weakly provoked in the setting of birth control patch Marilu Favre) at that time.  No immobilization prior to PE.  She has not been on any hormonal birth control since then. 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  Her sister also had miscarriages. - Patient's mother had recurrent DVT and PE and is on lifelong anticoagulation. - Coagulopathy work-up negative.  (NEGATIVE lupus anticoagulant panel, cardiolipin antibodies, beta-2 glycoprotein 1 antibodies, factor V Leiden, prothrombin gene mutation, protein C, protein S, and Antithrombin III). - Clinical picture was suggestive of familial hypercoagulable disorders, such as antiphospholipid syndrome.  However, coagulopathy work-up was unremarkable. - D-dimer marginally elevated at 0.51 (11/14/2021), but has normalized with D-dimer 0.38 as of 04/06/2022 - No clinical signs/symptoms of DVT or PE at this time.     - PLAN: There is no indication for lifelong anticoagulation at this time, but patient is aware that if she has any DVT or PE in the future she will need to return to our clinic and would likely need indefinite anticoagulation with any recurrent VTE. - We will tentatively  discharge patient from clinic at this time, but she is aware that she can follow-up as needed if she were to have a recurrent VTE event in the future. - Patient has been educated on signs/symptoms that would prompt immediate medical attention.   - Agree with permanent  discontinuation of hormonal contraception.   2.  Family history of breast cancer: - Mother had breast cancer at age 47. - Sister had breast cancer at age 45. - Patient's mother was found to have BRCA1 mutation - Patient reports that she was tested for BRCA 1 and 2 and was negative. - PLAN: Given strong family history, we have recommended screening mammogram/MRI starting immediately.  She will follow-up with her PCP for this.   3.  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 - Mother had recurrent DVT and PE and is on lifelong anticoagulation. - Mother had breast cancer at age 71. - Sister had breast cancer at age 61.  Sister also had miscarriages. - Maternal grandmother had stomach cancer.    I discussed the assessment and treatment plan with the patient. The patient was provided an opportunity to ask questions and all were answered. The patient agreed with the plan and demonstrated an understanding of the instructions.   The patient was advised to call back or seek an in-person evaluation if the symptoms worsen or if the condition fails to improve as anticipated.  I provided 8 minutes of non-face-to-face time during this encounter.   Harriett Rush, PA-C 04/13/2022 3:13 PM

## 2022-04-13 ENCOUNTER — Inpatient Hospital Stay (HOSPITAL_COMMUNITY): Payer: Managed Care, Other (non HMO) | Admitting: Physician Assistant

## 2022-04-13 DIAGNOSIS — Z86711 Personal history of pulmonary embolism: Secondary | ICD-10-CM | POA: Diagnosis not present

## 2022-04-13 DIAGNOSIS — Z803 Family history of malignant neoplasm of breast: Secondary | ICD-10-CM

## 2022-04-13 DIAGNOSIS — Z800 Family history of malignant neoplasm of digestive organs: Secondary | ICD-10-CM

## 2022-04-13 NOTE — Patient Instructions (Signed)
[facility] at [facility] Discharge Instructions  You were seen today by Rojelio Brenner PA-C for your history of blood clots in your lungs (pulmonary embolism).  Your blood tests did not show any underlying blood-clotting disorders that would have caused this blood clot, so you do NOT need to go back onto any blood thinner medications right now.  However, it is VERY IMPORTANT that you know the symptoms of a blood clot in case it happens again.  Please read the attached handouts and seek IMMEDIATE medical attention if you have any sudden chest pain, difficulty breathing, racing heart, or one-sided leg swelling.  Do not take any hormonal birth control, since this may have been what caused your blood clot 2 years ago.  You do not need to be seen at our clinic again at this time.  However, if you have any blood clots again in the future, you will need to come back and see [place] again to discuss possible lifelong blood thinners.  _____________________________________________________________  Should you have questions after your visit to [facility], please contact our office at [phone] and follow the prompts.  Our office hours are 8:00 a.m. and 4:30 p.m. Monday - Friday.  Please note that voicemails left after 4:00 p.m. may not be returned until the following business day.  We are closed weekends and major holidays.  You do have access to a nurse 24-7, just call the main number to the clinic [phone] and do not press any options, hold on the line and a nurse will answer the phone.

## 2022-09-28 NOTE — L&D Delivery Note (Addendum)
OB/GYN Faculty Practice Delivery Note  [name] is a 37 y.o. [id number] s/p VD at [redacted]w[redacted]d. She was admitted for active labor.   ROM: 1h 23m with Clear fluid GBS Status: Positive/-- (07/25 1400) - Received 2x doses of Penicillin Maximum Maternal Temperature: 98.2 F  Labor Progress: Initial SVE: 5/100/-1. She then progressed to complete.   Delivery Date/Time: 05/09/23 08:10 Delivery: Called to room and patient was complete and pushing. Head delivered LOA. No nuchal cord present x1, delivered through with ease. Shoulder and body delivered in usual fashion. Infant with spontaneous cry, placed on mother's abdomen, dried and stimulated. Cord clamped x 2 after 1-minute delay, and cut by family. Cord blood drawn. Placenta delivered spontaneously with gentle cord traction. Fundus firm with massage and Pitocin. Labia, perineum, vagina, and cervix inspected with 1st degree perineal tear, repaired with 4-0 Vicryl.  Baby Weight: pending  Placenta: 3 vessel, intact. Sent to L&D Complications: None Lacerations: 1st degree perineal EBL: 238 mL Analgesia: Epidural   Infant:  APGAR (1 MIN): 9  APGAR (5 MINS): 9   Olga Millers, DO PGY-3 Family Medicine 05/09/2023, 8:34 AM   Fellow ATTESTATION  I was present and gloved for this delivery and agree with the above documentation in the resident's note except as below.  Celedonio Savage, MD [facility] [employer] (Faculty Practice) 05/09/2023, 8:46 AM

## 2022-10-28 ENCOUNTER — Other Ambulatory Visit: Payer: Self-pay | Admitting: Adult Health

## 2022-10-28 ENCOUNTER — Telehealth: Payer: Self-pay | Admitting: Adult Health

## 2022-10-28 DIAGNOSIS — O3680X0 Pregnancy with inconclusive fetal viability, not applicable or unspecified: Secondary | ICD-10-CM

## 2022-10-28 NOTE — Telephone Encounter (Signed)
We received a confirmation of pregnancy for the patient lmp 08/16/22. Can we get a order for a dating put in for [facility] so that I can call to schedule please and thank

## 2022-10-28 NOTE — Progress Notes (Signed)
Order dating [place]

## 2022-11-24 ENCOUNTER — Other Ambulatory Visit: Payer: Self-pay | Admitting: Adult Health

## 2022-11-24 ENCOUNTER — Ambulatory Visit: Payer: Managed Care, Other (non HMO)

## 2022-11-24 DIAGNOSIS — O3680X0 Pregnancy with inconclusive fetal viability, not applicable or unspecified: Secondary | ICD-10-CM | POA: Diagnosis not present

## 2022-11-24 DIAGNOSIS — Z3A14 14 weeks gestation of pregnancy: Secondary | ICD-10-CM

## 2022-11-26 ENCOUNTER — Encounter: Payer: Self-pay | Admitting: Obstetrics and Gynecology

## 2022-11-26 ENCOUNTER — Ambulatory Visit (INDEPENDENT_AMBULATORY_CARE_PROVIDER_SITE_OTHER): Payer: Managed Care, Other (non HMO) | Admitting: Obstetrics and Gynecology

## 2022-11-26 ENCOUNTER — Encounter: Payer: Managed Care, Other (non HMO) | Admitting: *Deleted

## 2022-11-26 VITALS — BP 114/63 | HR 74 | Wt 186.0 lb

## 2022-11-26 DIAGNOSIS — Z86711 Personal history of pulmonary embolism: Secondary | ICD-10-CM

## 2022-11-26 DIAGNOSIS — O09522 Supervision of elderly multigravida, second trimester: Secondary | ICD-10-CM

## 2022-11-26 DIAGNOSIS — Z3A14 14 weeks gestation of pregnancy: Secondary | ICD-10-CM

## 2022-11-26 DIAGNOSIS — R000 Tachycardia, unspecified: Secondary | ICD-10-CM

## 2022-11-26 DIAGNOSIS — Z6831 Body mass index (BMI) 31.0-31.9, adult: Secondary | ICD-10-CM | POA: Diagnosis not present

## 2022-11-26 DIAGNOSIS — Z3490 Encounter for supervision of normal pregnancy, unspecified, unspecified trimester: Secondary | ICD-10-CM | POA: Insufficient documentation

## 2022-11-26 DIAGNOSIS — Z3480 Encounter for supervision of other normal pregnancy, unspecified trimester: Secondary | ICD-10-CM

## 2022-11-26 DIAGNOSIS — O09529 Supervision of elderly multigravida, unspecified trimester: Secondary | ICD-10-CM | POA: Insufficient documentation

## 2022-11-26 DIAGNOSIS — O0990 Supervision of high risk pregnancy, unspecified, unspecified trimester: Secondary | ICD-10-CM | POA: Insufficient documentation

## 2022-11-26 DIAGNOSIS — Z131 Encounter for screening for diabetes mellitus: Secondary | ICD-10-CM

## 2022-11-26 MED ORDER — ENOXAPARIN SODIUM 40 MG/0.4ML IJ SOSY: 40.0000 mg | PREFILLED_SYRINGE | INTRAMUSCULAR | 11 refills | Status: DC

## 2022-11-26 NOTE — Progress Notes (Signed)
Subjective:  [name] is a 37 y.o. [id number] at 54w4dbeing seen today for her first OB visit. EDD by LMP and confirmed by U/S. Placenta previa on first trimester U/S. H/O TSVD x 2 without problems. ongoing prenatal care.  She is currently monitored for the following issues for this high-risk pregnancy and has Family history of breast cancer; Family history of BRCA1 gene positive; Genetic testing; History of pulmonary embolus (PE); Weight gain; Rectocele; Pelvic relaxation; Encounter for supervision of normal pregnancy, antepartum; AMA (advanced maternal age) multigravida 35+; and Tachycardia on their problem list.  Patient reports episodes of tachycardia   . Vag. Bleeding: None.   . Denies leaking of fluid.   The following portions of the patient's history were reviewed and updated as appropriate: allergies, current medications, past family history, past medical history, past social history, past surgical history and problem list. Problem list updated.  Objective:   Vitals:   11/26/22 1124  BP: 114/63  Pulse: 74  Weight: 186 lb (84.4 kg)    Fetal Status:           General:  Alert, oriented and cooperative. Patient is in no acute distress.  Skin: Skin is warm and dry. No rash noted.   Cardiovascular: Normal heart rate noted  Respiratory: Normal respiratory effort, no problems with respiration noted  Abdomen: Soft, gravid, appropriate for gestational age. Pain/Pressure: Absent     Pelvic:  Cervical exam deferred        Extremities: Normal range of motion.  Edema: None  Mental Status: Normal mood and affect. Normal behavior. Normal judgment and thought content.   Urinalysis:      Assessment and Plan:  Pregnancy: [id number] 175w4d1. Supervision of other normal pregnancy, antepartum Prenatal care and labs reviewed with pt Genetic testing discussed, undecided at this time [facility] for placenta previa, reassured pt that most previa's  resolved. Will reevaluate at anatomy scan - AMB Referral to Cardio Obstetrics due to episodes of tachycardia - CHL AMB BABYSCRIPTS SCHEDULE OPTIMIZATION - CBC/D/Plt+RPR+Rh+ABO+RubIgG... - GC/Chlamydia Probe Amp - Urine Culture - Hemoglobin A1c  2. Body mass index 31.0-31.9, adult  - Hemoglobin A1c   3 History of pulmonary embolus (PE) Indications for Lovenox reviewed with pt Offered nurse visit for injection instructions, pt declined. States she gives her partner insulin. Injection sites reviewed with pt - enoxaparin (LOVENOX) 40 MG/0.4ML injection; Inject 0.4 mLs (40 mg total) into the skin daily.  Dispense: 30 mL; Refill: 11  4. Multigravida of advanced maternal age in second trimester Undecided about genetics at this time  5. Tachycardia Referral to OB cards   Preterm labor symptoms and general obstetric precautions including but not limited to vaginal bleeding, contractions, leaking of fluid and fetal movement were reviewed in detail with the patient. Please refer to After Visit Summary for other counseling recommendations.  Return in about 4 weeks (around 12/24/2022) for OB visit, face to face, any provider.   ErChancy MilroyMD

## 2022-11-28 LAB — CBC/D/PLT+RPR+RH+ABO+RUBIGG...
Antibody Screen: NEGATIVE
Basophils Absolute: 0 10*3/uL (ref 0.0–0.2)
Basos: 0 %
EOS (ABSOLUTE): 0.1 10*3/uL (ref 0.0–0.4)
Eos: 1 %
HCV Ab: NONREACTIVE
HIV Screen 4th Generation wRfx: NONREACTIVE
Hematocrit: 36.2 % (ref 34.0–46.6)
Hemoglobin: 11.7 g/dL (ref 11.1–15.9)
Hepatitis B Surface Ag: NEGATIVE
Immature Grans (Abs): 0 10*3/uL (ref 0.0–0.1)
Immature Granulocytes: 0 %
Lymphocytes Absolute: 2 10*3/uL (ref 0.7–3.1)
Lymphs: 28 %
MCH: 29.9 pg (ref 26.6–33.0)
MCHC: 32.3 g/dL (ref 31.5–35.7)
MCV: 93 fL (ref 79–97)
Monocytes Absolute: 0.5 10*3/uL (ref 0.1–0.9)
Monocytes: 7 %
Neutrophils Absolute: 4.6 10*3/uL (ref 1.4–7.0)
Neutrophils: 64 %
Platelets: 239 10*3/uL (ref 150–450)
RBC: 3.91 x10E6/uL (ref 3.77–5.28)
RDW: 12.4 % (ref 11.7–15.4)
RPR Ser Ql: NONREACTIVE
Rh Factor: POSITIVE
Rubella Antibodies, IGG: 1.01 index (ref >0.99)
WBC: 7.2 10*3/uL (ref 3.4–10.8)

## 2022-11-28 LAB — HEMOGLOBIN A1C
Est. average glucose Bld gHb Est-mCnc: 105 mg/dL
Hgb A1c MFr Bld: 5.3 % (ref 4.8–5.6)

## 2022-11-28 LAB — HCV INTERPRETATION

## 2022-11-29 LAB — URINE CULTURE

## 2022-11-30 LAB — GC/CHLAMYDIA PROBE AMP
Chlamydia trachomatis, NAA: NEGATIVE
Neisseria Gonorrhoeae by PCR: NEGATIVE

## 2022-12-01 ENCOUNTER — Encounter: Payer: Managed Care, Other (non HMO) | Admitting: *Deleted

## 2022-12-01 ENCOUNTER — Encounter: Payer: Managed Care, Other (non HMO) | Admitting: Obstetrics and Gynecology

## 2022-12-09 IMAGING — MG MM DIGITAL DIAGNOSTIC UNILAT*L* W/ TOMO W/ CAD
4 series · 4 of 12 positions shown · non-contrast
Comparison: Screening mammogram dated 12/10/2021.

CLINICAL DATA: Screening recall from baseline mammography for
possible left breast asymmetry. Patient has a strong family history
of breast cancer, including in mother and sister having been
diagnosed with breast cancer.

EXAM:
DIGITAL DIAGNOSTIC UNILATERAL LEFT MAMMOGRAM WITH TOMOSYNTHESIS AND
CAD; ULTRASOUND LEFT BREAST LIMITED
TECHNIQUE: Left digital diagnostic mammography and breast tomosynthesis was
performed. The images were evaluated with computer-aided detection.;
Targeted ultrasound examination of the left breast was performed.

[L MLO synth-2D]
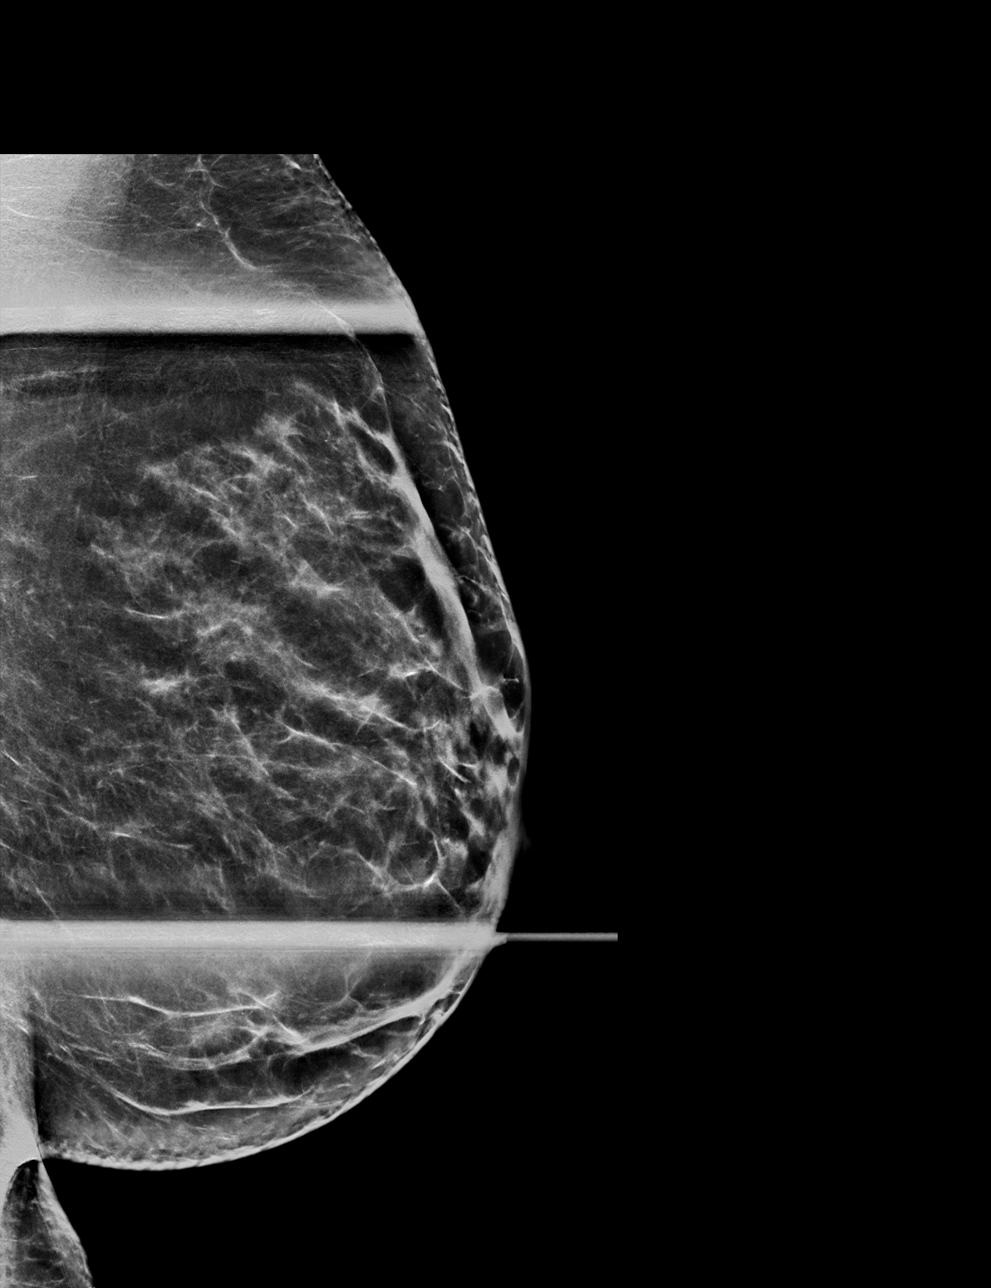

[L CC synth-2D]
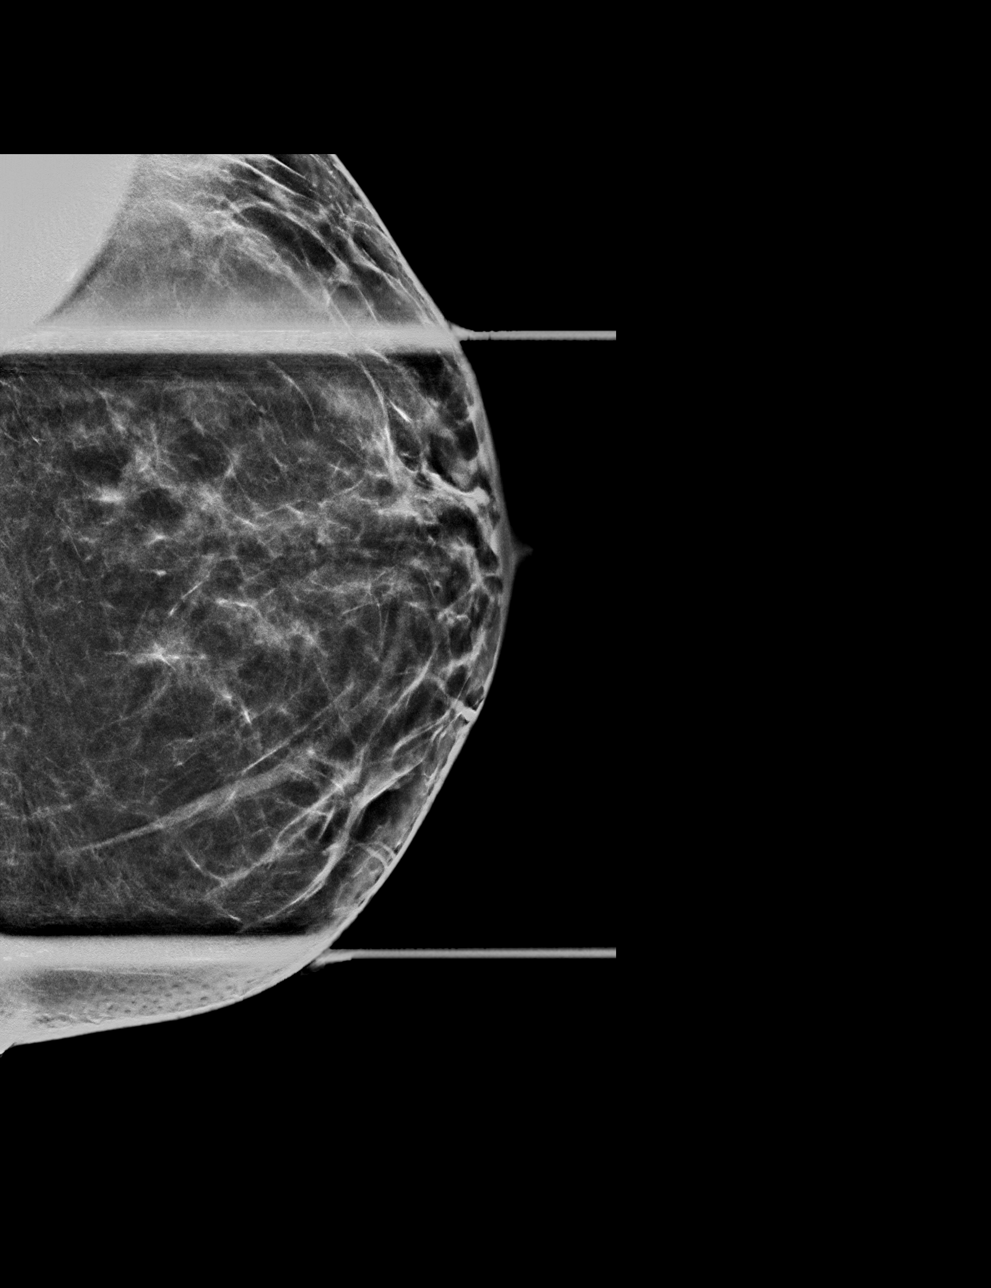

[L MLO tomo · tomo slice 34/67.0]
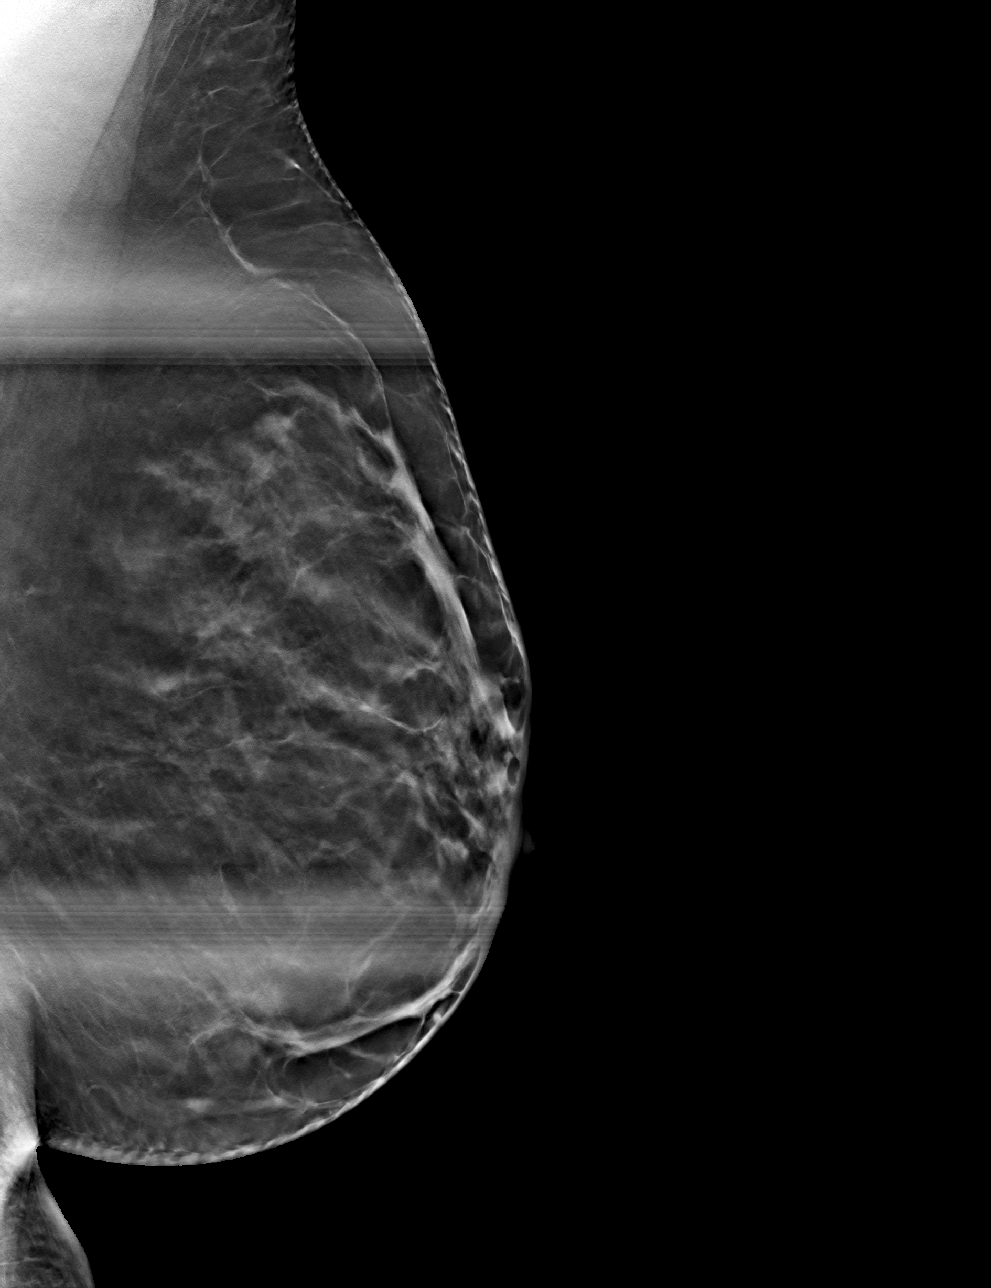

[L CC tomo · tomo slice 31/62.0]
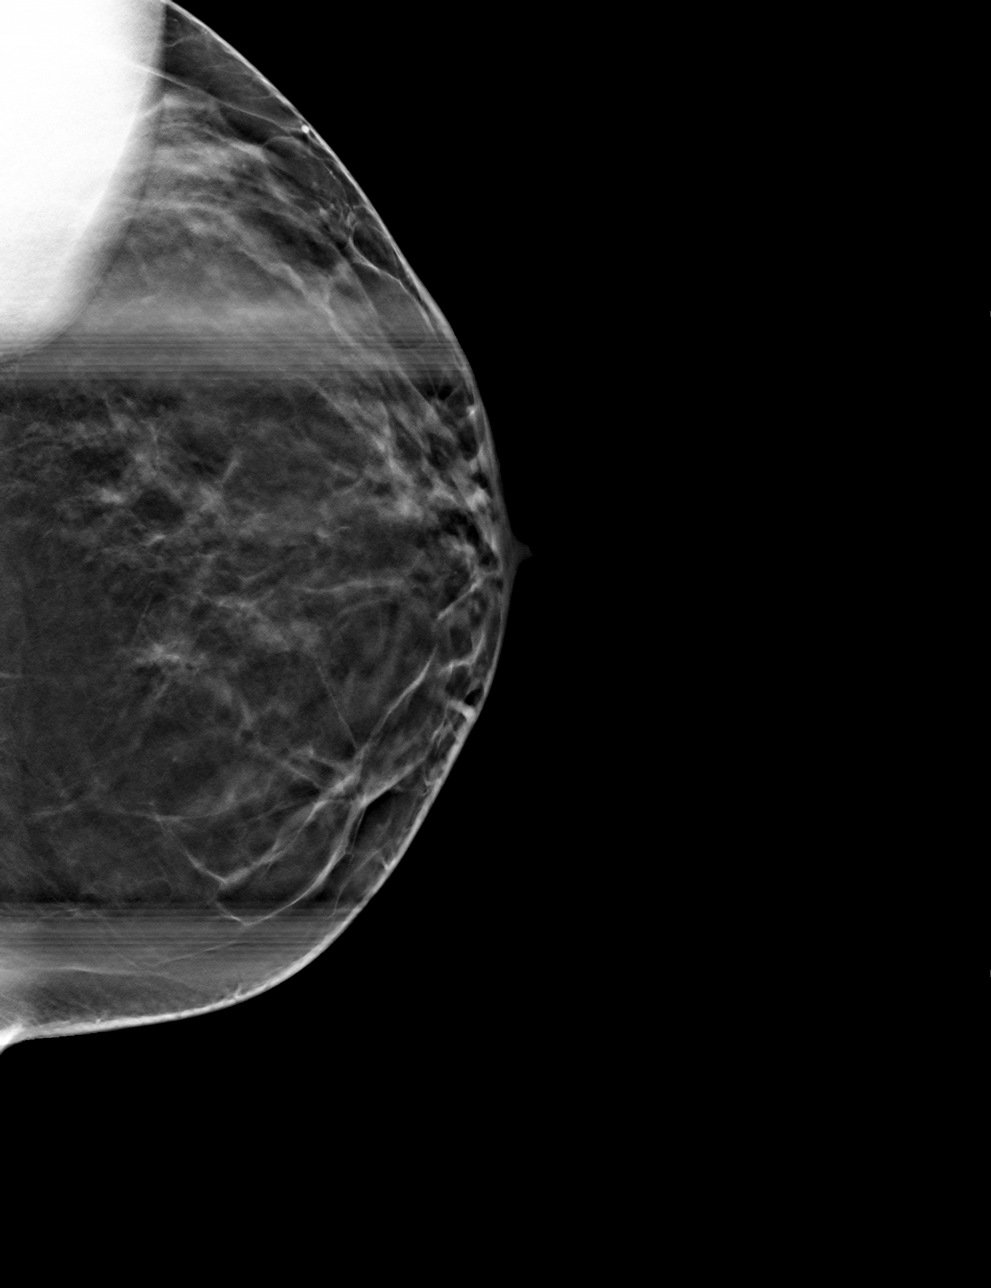

[4 of 12 positions shown; findings below may reference images not displayed]

ACR Breast Density Category c: The breast tissue is heterogeneously
dense, which may obscure small masses.
FINDINGS: Additional tomograms of the left breast were performed. The
initially questioned asymmetry is less apparent on the additional
imaging, demonstrating features suggestive of normal/dense
fibroglandular tissue.

Targeted ultrasound of the entire central and superior left breast
was performed. No suspicious masses or abnormality seen, only
heterogeneous fibroglandular tissue identified.
IMPRESSION: Probably benign left breast asymmetry.

RECOMMENDATION:
Recommend six-month follow-up diagnostic to demonstrate stability of
the probably benign left breast asymmetry.

I have discussed the findings and recommendations with the patient.
If applicable, a reminder letter will be sent to the patient
regarding the next appointment.

BI-RADS CATEGORY  3: Probably benign.

## 2022-12-09 IMAGING — US US BREAST*L* LIMITED INC AXILLA
1 series · 6 of 6 positions shown · non-contrast
Comparison: Screening mammogram dated 12/10/2021.

CLINICAL DATA: Screening recall from baseline mammography for
possible left breast asymmetry. Patient has a strong family history
of breast cancer, including in mother and sister having been
diagnosed with breast cancer.

EXAM:
DIGITAL DIAGNOSTIC UNILATERAL LEFT MAMMOGRAM WITH TOMOSYNTHESIS AND
CAD; ULTRASOUND LEFT BREAST LIMITED
TECHNIQUE: Left digital diagnostic mammography and breast tomosynthesis was
performed. The images were evaluated with computer-aided detection.;
Targeted ultrasound examination of the left breast was performed.

[Series 1: us breast*left* limited inc axilla · 0.07mm/px · 6 of 6 slices shown]
[im 1/6]
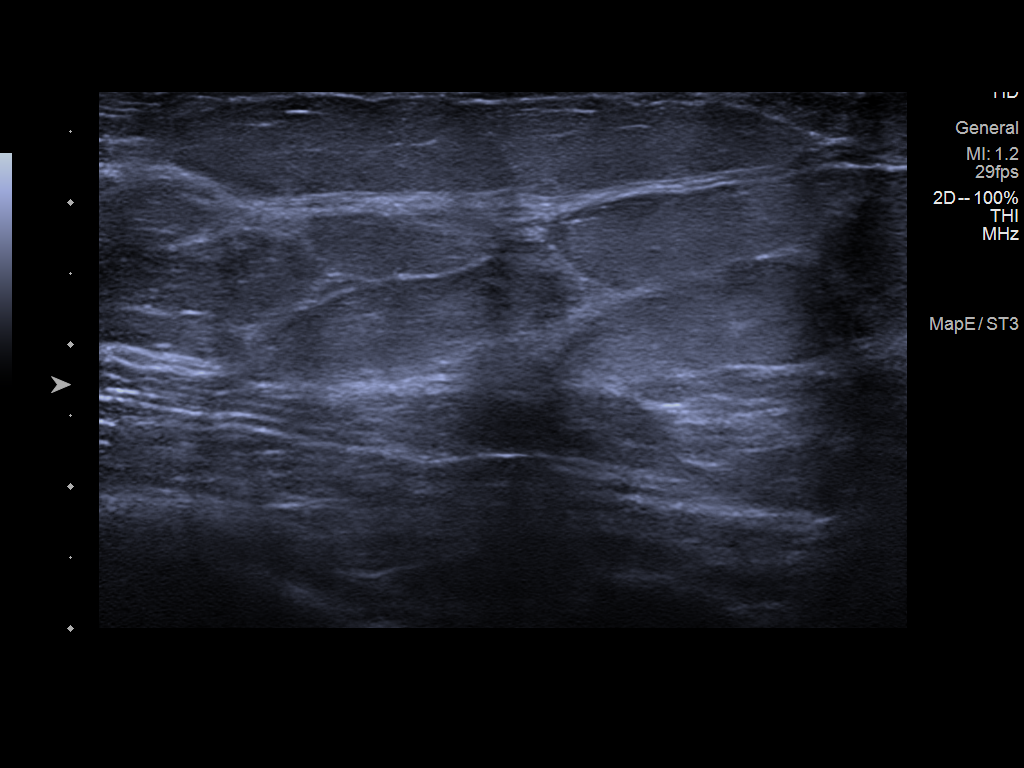
[im 2/6]
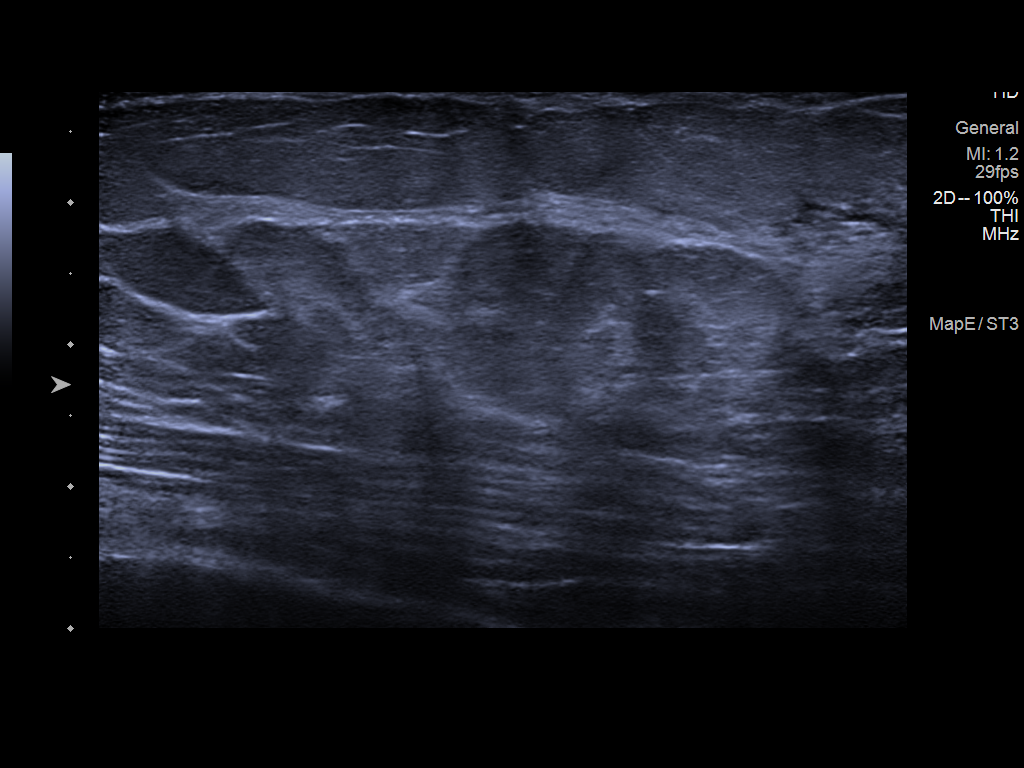
[im 3/6]
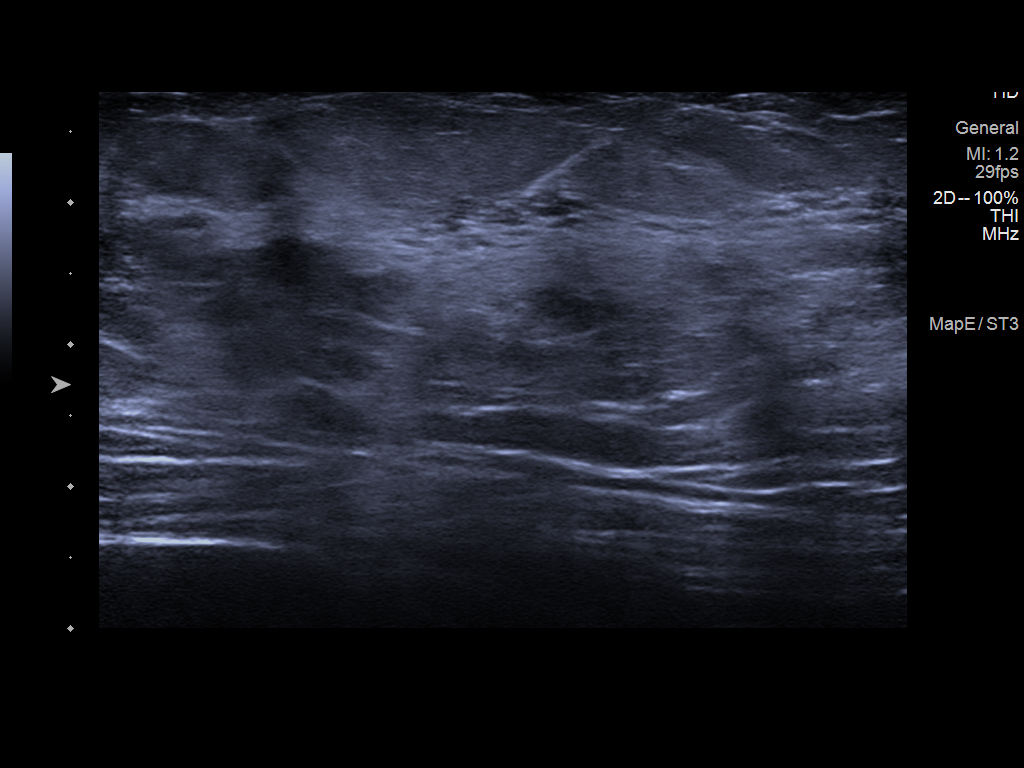
[im 4/6]
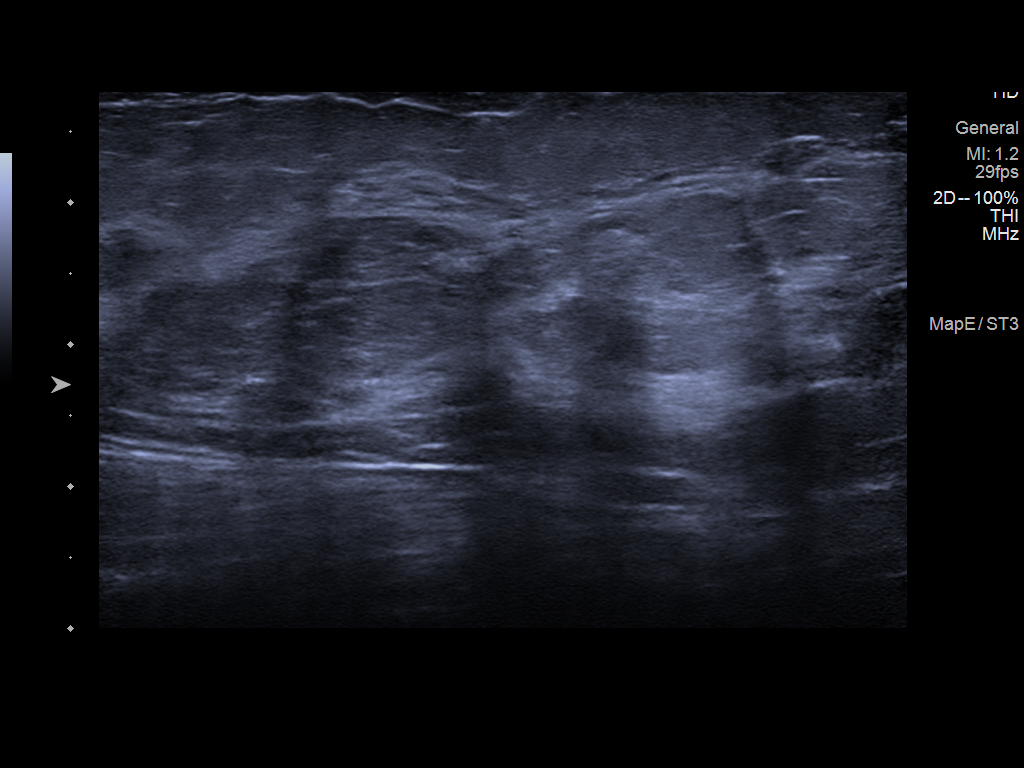
[im 5/6]
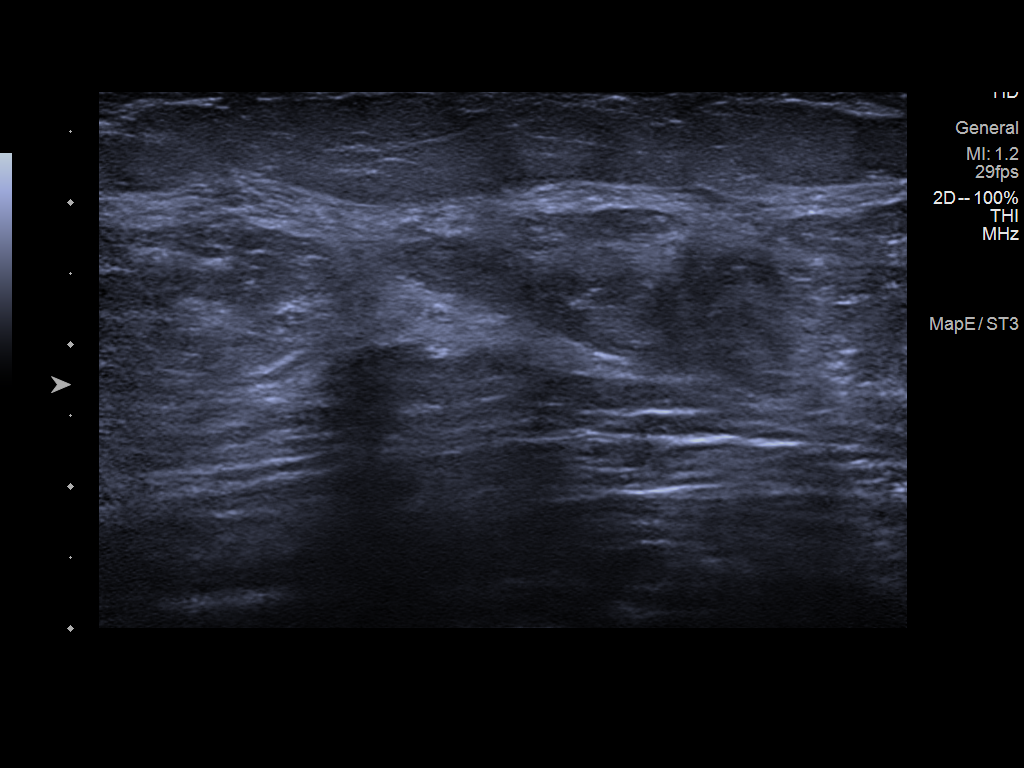
[im 6/6]
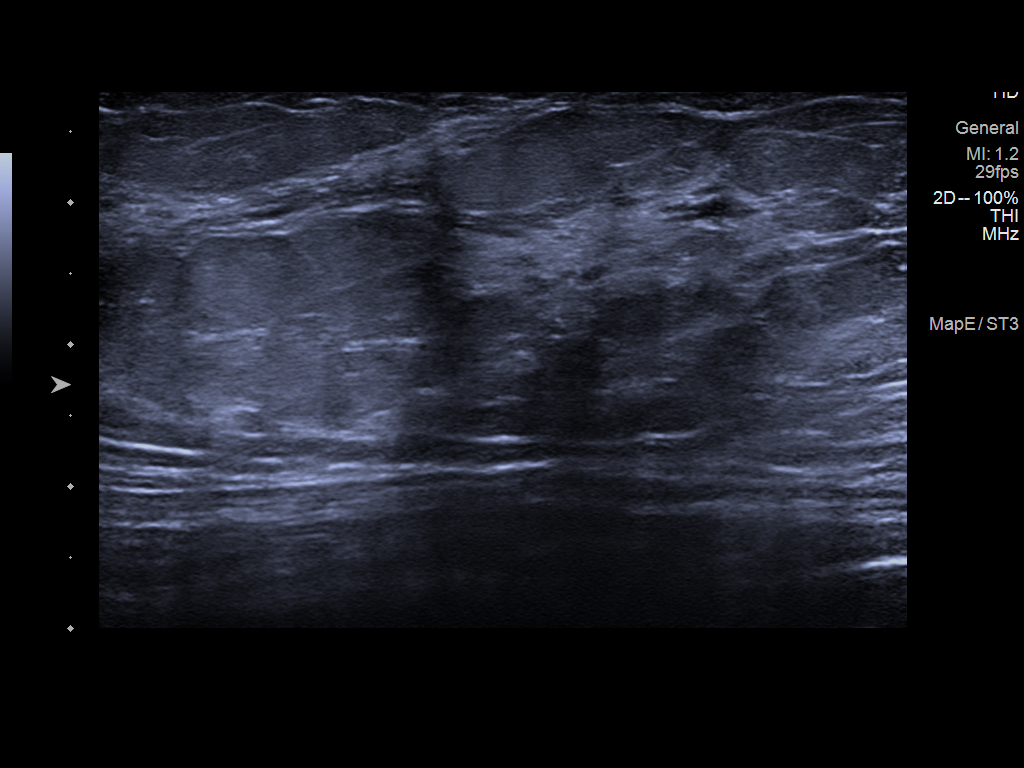

[6 of 6 positions shown; findings below may reference images not displayed]

ACR Breast Density Category c: The breast tissue is heterogeneously
dense, which may obscure small masses.
FINDINGS: Additional tomograms of the left breast were performed. The
initially questioned asymmetry is less apparent on the additional
imaging, demonstrating features suggestive of normal/dense
fibroglandular tissue.

Targeted ultrasound of the entire central and superior left breast
was performed. No suspicious masses or abnormality seen, only
heterogeneous fibroglandular tissue identified.
IMPRESSION: Probably benign left breast asymmetry.

RECOMMENDATION:
Recommend six-month follow-up diagnostic to demonstrate stability of
the probably benign left breast asymmetry.

I have discussed the findings and recommendations with the patient.
If applicable, a reminder letter will be sent to the patient
regarding the next appointment.

BI-RADS CATEGORY  3: Probably benign.

## 2022-12-23 ENCOUNTER — Other Ambulatory Visit: Payer: Self-pay | Admitting: Obstetrics and Gynecology

## 2022-12-23 DIAGNOSIS — Z363 Encounter for antenatal screening for malformations: Secondary | ICD-10-CM

## 2022-12-24 ENCOUNTER — Ambulatory Visit: Payer: Managed Care, Other (non HMO)

## 2022-12-24 ENCOUNTER — Encounter: Payer: Self-pay | Admitting: Advanced Practice Midwife

## 2022-12-24 ENCOUNTER — Other Ambulatory Visit (HOSPITAL_COMMUNITY)
Admission: RE | Admit: 2022-12-24 | Discharge: 2022-12-24 | Disposition: A | Discharge status: Home / Self Care | Payer: Managed Care, Other (non HMO) | Source: Ambulatory Visit | Attending: Advanced Practice Midwife | Admitting: Advanced Practice Midwife

## 2022-12-24 ENCOUNTER — Ambulatory Visit: Payer: Managed Care, Other (non HMO) | Admitting: Advanced Practice Midwife

## 2022-12-24 VITALS — BP 96/61 | HR 66 | Wt 188.4 lb

## 2022-12-24 DIAGNOSIS — O09522 Supervision of elderly multigravida, second trimester: Secondary | ICD-10-CM

## 2022-12-24 DIAGNOSIS — Z86711 Personal history of pulmonary embolism: Secondary | ICD-10-CM

## 2022-12-24 DIAGNOSIS — Z3A18 18 weeks gestation of pregnancy: Secondary | ICD-10-CM | POA: Diagnosis not present

## 2022-12-24 DIAGNOSIS — Z363 Encounter for antenatal screening for malformations: Secondary | ICD-10-CM | POA: Diagnosis not present

## 2022-12-24 DIAGNOSIS — Z124 Encounter for screening for malignant neoplasm of cervix: Secondary | ICD-10-CM | POA: Insufficient documentation

## 2022-12-24 DIAGNOSIS — Z3480 Encounter for supervision of other normal pregnancy, unspecified trimester: Secondary | ICD-10-CM

## 2022-12-24 MED ORDER — OMEPRAZOLE MAGNESIUM 20 MG PO TBEC: 20.0000 mg | DELAYED_RELEASE_TABLET | Freq: Every day | ORAL | 6 refills | Status: DC

## 2022-12-24 NOTE — Progress Notes (Signed)
[place] 0000000 wks,cephalic,posterior placenta gr 0,CX 3.5 cm,SVP of fluid 4.6 cm,FHR 146 bpm,EFW 311 g 96%,anatomy complete,no obvious abnormalities

## 2022-12-24 NOTE — Progress Notes (Signed)
HIGH-RISK PREGNANCY VISIT Patient name: [name] MRN [id number]  Date of birth: [birth year] Chief Complaint:   Routine Prenatal Visit and Pregnancy Ultrasound  History of Present Illness:   [name] is a 37 y.o. [id number] female at [redacted]w[redacted]d with an Estimated Date of Delivery: 05/23/23 being seen today for ongoing management of a high-risk pregnancy complicated by Hx PE.    Today she reports constipation and heartburn/reflux. Contractions: Not present.  .  Movement: Present. denies leaking of fluid.      11/26/2022   11:36 AM 02/21/2020   11:52 AM  Depression screen PHQ 2/9  Decreased Interest 0 0  Down, Depressed, Hopeless 0 0  PHQ - 2 Score 0 0  Altered sleeping 0 0  Tired, decreased energy 0 2  Change in appetite 0 2  Feeling bad or failure about yourself  0 0  Trouble concentrating 0 0  Moving slowly or fidgety/restless 0 0  Suicidal thoughts 0 0  PHQ-9 Score 0 4  Difficult doing work/chores  Not difficult at all        11/26/2022   11:36 AM 02/21/2020   11:52 AM  GAD 7 : Generalized Anxiety Score  Nervous, Anxious, on Edge 0 0  Control/stop worrying 0 0  Worry too much - different things 1 2  Trouble relaxing 0 2  Restless 0 2  Easily annoyed or irritable 0 0  Afraid - awful might happen 0 0  Total GAD 7 Score 1 6  Anxiety Difficulty  Not difficult at all     Review of Systems:   Pertinent items are noted in HPI Denies abnormal vaginal discharge w/ itching/odor/irritation, headaches, visual changes, shortness of breath, chest pain, abdominal pain, severe nausea/vomiting, or problems with urination or bowel movements unless otherwise stated above. Pertinent History Reviewed:  Reviewed past medical,surgical, social, obstetrical and family history.  Reviewed problem list, medications and allergies. Physical Assessment:   Vitals:   12/24/22 1018  BP: 96/61  Pulse: 66  Weight: 188 lb 6.4 oz (85.5 kg)  Body mass index is 33.38 kg/m.           Physical  Examination:   General appearance: alert, well appearing, and in no distress  Mental status: alert, oriented to person, place, and time  Skin: warm & dry   Extremities: Edema: None    Cardiovascular: normal heart rate noted  Respiratory: normal respiratory effort, no distress  Abdomen: gravid, soft, non-tender  Pelvic: Pap collected         Fetal Status:     Movement: Present    Fetal Surveillance Testing today: [place] 0000000 wks,cephalic,posterior placenta gr 0,CX 3.5 cm,SVP of fluid 4.6 cm,FHR 146 bpm,EFW 311 g 96%,anatomy complete,no obvious abnormalities    Chaperone: Peggy Dones    No results found for this or any previous visit (from the past 24 hour(s)).  Assessment & Plan:  High-risk pregnancy: [id number] at [redacted]w[redacted]d with an Estimated Date of Delivery: 05/23/23   1. Supervision of other normal pregnancy, antepartum   2. Pap smear for cervical cancer screening  - Cytology - PAP( [place])  3. Multigravida of advanced maternal age in second trimester   4. History of pulmonary embolus (PE) Continue Lovenox 40mg  daily  5.  Heartburn:  rx prilosec  6.  Constipation:  may increase fiber, miralax prn  Meds:  Meds ordered this encounter  Medications   omeprazole (PRILOSEC OTC) 20 MG tablet    Sig: Take 1 tablet (20  mg total) by mouth daily.    Dispense:  30 tablet    Refill:  6    Order Specific Question:   Supervising Provider    Answer:   Tania Ade H [[id number]]    Orders: No orders of the defined types were placed in this encounter.    Labs/procedures today: pap and U/S   Reviewed:  general obstetric precautions including but not limited to vaginal bleeding, contractions, leaking of fluid and fetal movement were reviewed in detail with the patient.  All questions were answered. Does have home bp cuff. Office bp cuff given: not applicable. Check bp weekly, let us know if consistently >140 and/or >90.  Follow-up: Return in about 4 weeks (around 01/21/2023) for  [place].   Future Appointments  Date Time Provider [place]  12/30/2022  9:00 AM Tobb, Godfrey Pick, DO [facility] None    No orders of the defined types were placed in this encounter.  Christin Fudge , DNP, [facility] 12/24/2022 10:58 AM

## 2022-12-24 NOTE — Patient Instructions (Signed)
[name], I greatly value your feedback.  If you receive a survey following your visit with [place] today, we appreciate you taking the time to fill it out.  Thanks, Nigel Berthold, CNM     [place]!!! It is now [place] at [facility] ([place], [place] [address]) Entrance located off of [address] parking   Go to [email or web address] to register for FREE online childbirth classes    Second Trimester of Pregnancy The second trimester is from week 14 through week 27 (months 4 through 6). The second trimester is often a time when you feel your best. Your body has adjusted to being pregnant, and you begin to feel better physically. Usually, morning sickness has lessened or quit completely, you may have more energy, and you may have an increase in appetite. The second trimester is also a time when the fetus is growing rapidly. At the end of the sixth month, the fetus is about 9 inches long and weighs about 1 pounds. You will likely begin to feel the baby move (quickening) between 16 and 20 weeks of pregnancy. Body changes during your second trimester Your body continues to go through many changes during your second trimester. The changes vary from woman to woman. Your weight will continue to increase. You will notice your lower abdomen bulging out. You may begin to get stretch marks on your hips, abdomen, and breasts. You may develop headaches that can be relieved by medicines. The medicines should be approved by your health care provider. You may urinate more often because the fetus is pressing on your bladder. You may develop or continue to have heartburn as a result of your pregnancy. You may develop constipation because certain hormones are causing the muscles that push waste through your intestines to slow down. You may develop hemorrhoids or swollen, bulging veins (varicose veins). You may have back pain. This is  caused by: Weight gain. Pregnancy hormones that are relaxing the joints in your pelvis. A shift in weight and the muscles that support your balance. Your breasts will continue to grow and they will continue to become tender. Your gums may bleed and may be sensitive to brushing and flossing. Dark spots or blotches (chloasma, mask of pregnancy) may develop on your face. This will likely fade after the baby is born. A dark line from your belly button to the pubic area (linea nigra) may appear. This will likely fade after the baby is born. You may have changes in your hair. These can include thickening of your hair, rapid growth, and changes in texture. Some women also have hair loss during or after pregnancy, or hair that feels dry or thin. Your hair will most likely return to normal after your baby is born.  What to expect at prenatal visits During a routine prenatal visit: You will be weighed to make sure you and the fetus are growing normally. Your blood pressure will be taken. Your abdomen will be measured to track your baby's growth. The fetal heartbeat will be listened to. Any test results from the previous visit will be discussed.  Your health care provider may ask you: How you are feeling. If you are feeling the baby move. If you have had any abnormal symptoms, such as leaking fluid, bleeding, severe headaches, or abdominal cramping. If you are using any tobacco products, including cigarettes, chewing tobacco, and electronic cigarettes. If you have any questions.  Other tests  that may be performed during your second trimester include: Blood tests that check for: Low iron levels (anemia). High blood sugar that affects pregnant women (gestational diabetes) between 54 and 28 weeks. Rh antibodies. This is to check for a protein on red blood cells (Rh factor). Urine tests to check for infections, diabetes, or protein in the urine. An ultrasound to confirm the proper growth and  development of the baby. An amniocentesis to check for possible genetic problems. Fetal screens for spina bifida and Down syndrome. HIV (human immunodeficiency virus) testing. Routine prenatal testing includes screening for HIV, unless you choose not to have this test.  Follow these instructions at home: Medicines Follow your health care provider's instructions regarding medicine use. Specific medicines may be either safe or unsafe to take during pregnancy. Take a prenatal vitamin that contains at least 600 micrograms (mcg) of folic acid. If you develop constipation, try taking a stool softener if your health care provider approves. Eating and drinking Eat a balanced diet that includes fresh fruits and vegetables, whole grains, good sources of protein such as meat, eggs, or tofu, and low-fat dairy. Your health care provider will help you determine the amount of weight gain that is right for you. Avoid raw meat and uncooked cheese. These carry germs that can cause birth defects in the baby. If you have low calcium intake from food, talk to your health care provider about whether you should take a daily calcium supplement. Limit foods that are high in fat and processed sugars, such as fried and sweet foods. To prevent constipation: Drink enough fluid to keep your urine clear or pale yellow. Eat foods that are high in fiber, such as fresh fruits and vegetables, whole grains, and beans. Activity Exercise only as directed by your health care provider. Most women can continue their usual exercise routine during pregnancy. Try to exercise for 30 minutes at least 5 days a week. Stop exercising if you experience uterine contractions. Avoid heavy lifting, wear low heel shoes, and practice good posture. A sexual relationship may be continued unless your health care provider directs you otherwise. Relieving pain and discomfort Wear a good support bra to prevent discomfort from breast tenderness. Take  warm sitz baths to soothe any pain or discomfort caused by hemorrhoids. Use hemorrhoid cream if your health care provider approves. Rest with your legs elevated if you have leg cramps or low back pain. If you develop varicose veins, wear support hose. Elevate your feet for 15 minutes, 3-4 times a day. Limit salt in your diet. Prenatal Care Write down your questions. Take them to your prenatal visits. Keep all your prenatal visits as told by your health care provider. This is important. Safety Wear your seat belt at all times when driving. Make a list of emergency phone numbers, including numbers for family, friends, the hospital, and police and fire departments. General instructions Ask your health care provider for a referral to a local prenatal education class. Begin classes no later than the beginning of month 6 of your pregnancy. Ask for help if you have counseling or nutritional needs during pregnancy. Your health care provider can offer advice or refer you to specialists for help with various needs. Do not use hot tubs, steam rooms, or saunas. Do not douche or use tampons or scented sanitary pads. Do not cross your legs for long periods of time. Avoid cat litter boxes and soil used by cats. These carry germs that can cause birth defects in the baby  and possibly loss of the fetus by miscarriage or stillbirth. Avoid all smoking, herbs, alcohol, and unprescribed drugs. Chemicals in these products can affect the formation and growth of the baby. Do not use any products that contain nicotine or tobacco, such as cigarettes and e-cigarettes. If you need help quitting, ask your health care provider. Visit your dentist if you have not gone yet during your pregnancy. Use a soft toothbrush to brush your teeth and be gentle when you floss. Contact a health care provider if: You have dizziness. You have mild pelvic cramps, pelvic pressure, or nagging pain in the abdominal area. You have persistent  nausea, vomiting, or diarrhea. You have a bad smelling vaginal discharge. You have pain when you urinate. Get help right away if: You have a fever. You are leaking fluid from your vagina. You have spotting or bleeding from your vagina. You have severe abdominal cramping or pain. You have rapid weight gain or weight loss. You have shortness of breath with chest pain. You notice sudden or extreme swelling of your face, hands, ankles, feet, or legs. You have not felt your baby move in over an hour. You have severe headaches that do not go away when you take medicine. You have vision changes. Summary The second trimester is from week 14 through week 27 (months 4 through 6). It is also a time when the fetus is growing rapidly. Your body goes through many changes during pregnancy. The changes vary from woman to woman. Avoid all smoking, herbs, alcohol, and unprescribed drugs. These chemicals affect the formation and growth your baby. Do not use any tobacco products, such as cigarettes, chewing tobacco, and e-cigarettes. If you need help quitting, ask your health care provider. Contact your health care provider if you have any questions. Keep all prenatal visits as told by your health care provider. This is important. This information is not intended to replace advice given to you by your health care provider. Make sure you discuss any questions you have with your health care provider.

## 2022-12-28 LAB — CYTOLOGY - PAP
Comment: NEGATIVE
Diagnosis: NEGATIVE
High risk HPV: NEGATIVE

## 2022-12-30 ENCOUNTER — Ambulatory Visit: Payer: Managed Care, Other (non HMO) | Attending: Cardiology

## 2022-12-30 ENCOUNTER — Encounter: Payer: Self-pay | Admitting: Cardiology

## 2022-12-30 ENCOUNTER — Ambulatory Visit: Payer: Managed Care, Other (non HMO) | Attending: Cardiology | Admitting: Cardiology

## 2022-12-30 VITALS — BP 124/60 | HR 84 | Ht 63.0 in | Wt 191.2 lb

## 2022-12-30 DIAGNOSIS — Z86711 Personal history of pulmonary embolism: Secondary | ICD-10-CM

## 2022-12-30 DIAGNOSIS — R002 Palpitations: Secondary | ICD-10-CM | POA: Diagnosis not present

## 2022-12-30 NOTE — Progress Notes (Signed)
Cardio-Obstetrics Clinic  New Evaluation  Date:  12/30/2022   ID:  [name], DOB 02-07-1986, MRN [id number]  PCP:  Sharilyn Sites, MD   [facility] Cardiologist:  Berniece Salines, DO  Electrophysiologist:  None       Referring MD: Chancy Milroy, MD   Chief Complaint:" I am having palpitations"  History of Present Illness:    [name] is a 37 y.o. female [G5P2022] who is being seen today for the evaluation of palpitations at the request of Chancy Milroy, MD.   Medical history includes history of pulmonary embolism she was on anticoagulation appropriately and then transition off, she is now back on Lovenox for this pregnancy.  One of the biggest issues while she is here is fact that she has been experiencing intermittent palpitations.  She was asked to come to see cardiology.  She is currently [redacted] weeks pregnant.   Prior CV Studies Reviewed: The following studies were reviewed today:   Past Medical History:  Diagnosis Date   Family history of BRCA1 gene positive    Family history of breast cancer    History of pulmonary embolus (PE)     Past Surgical History:  Procedure Laterality Date   NO PAST SURGERIES     REMOVAL OF NON VAGINAL CONTRACEPTIVE DEVICE Right 06/07/2014   Procedure: REMOVAL OF DEEP IMPLANON IMPLANT, ULTRASOUND GUIDED, RIGHT UPPER ARM;  Surgeon: Jonnie Kind, MD;  Location: [facility];  Service: Gynecology;  Laterality: Right;      OB History     Gravida  5   Para  2   Term  2   Preterm      AB  2   Living  2      SAB  2   IAB      Ectopic      Multiple      Live Births  2               Current Medications: Current Meds  Medication Sig   enoxaparin (LOVENOX) 40 MG/0.4ML injection Inject 0.4 mLs (40 mg total) into the skin daily.   omeprazole (PRILOSEC OTC) 20 MG tablet Take 1 tablet (20 mg total) by mouth daily.   Prenatal Vit-Fe Fumarate-FA (PRENATAL VITAMIN PO) Take by mouth.     Allergies:    Patient has no known allergies.   Social History   Socioeconomic History   Marital status: Married    Spouse name: Not on file   Number of children: Not on file   Years of education: Not on file   Highest education level: Not on file  Occupational History   Not on file  Tobacco Use   Smoking status: Never   Smokeless tobacco: Never  Vaping Use   Vaping Use: Never used  Substance and Sexual Activity   Alcohol use: No   Drug use: No   Sexual activity: Yes    Birth control/protection: None  Other Topics Concern   Not on file  Social History Narrative   Not on file   Social Determinants of Health   Financial Resource Strain: Medium Risk (11/26/2022)   Overall Financial Resource Strain (CARDIA)    Difficulty of Paying Living Expenses: Somewhat hard  Food Insecurity: No Food Insecurity (11/26/2022)   Hunger Vital Sign    Worried About Running Out of Food in the Last Year: Never true    Ran Out of Food in the Last Year: Never true  Transportation Needs: No Transportation Needs (11/26/2022)   PRAPARE - Hydrologist (Medical): No    Lack of Transportation (Non-Medical): No  Physical Activity: Sufficiently Active (11/26/2022)   Exercise Vital Sign    Days of Exercise per Week: 3 days    Minutes of Exercise per Session: 60 min  Stress: Stress Concern Present (11/26/2022)   [place]    Feeling of Stress : To some extent  Social Connections: Moderately Integrated (11/26/2022)   Social Connection and Isolation Panel [NHANES]    Frequency of Communication with Friends and Family: Twice a week    Frequency of Social Gatherings with Friends and Family: Three times a week    Attends Religious Services: 1 to 4 times per year    Active Member of Clubs or Organizations: No    Attends Archivist Meetings: Never    Marital Status: Married      Family History  Problem Relation Age of  Onset   Diabetes Mother    Hypertension Mother    Breast cancer Mother 37   Breast cancer Sister    Cancer Sister        breast   Breast cancer Maternal Aunt 60   Stomach cancer Maternal Aunt    Stomach cancer Maternal Grandfather       ROS:   Please see the history of present illness.    Palpitations All other systems reviewed and are negative.   Labs/EKG Reviewed:    EKG:   EKG is was ordered today.  The ekg ordered today demonstrates sinus rhythm, heart rate 84 beats a minute with sinus arrhythmia.  Recent Labs: 11/27/2022: Hemoglobin 11.7; Platelets 239   Recent Lipid Panel No results found for: "CHOL", "TRIG", "HDL", "CHOLHDL", "LDLCALC", "LDLDIRECT"  Physical Exam:    VS:  BP 124/60   Pulse 84   Ht 5\' 3"  (1.6 m)   Wt 191 lb 3.2 oz (86.7 kg)   LMP 08/16/2022   SpO2 99%   BMI 33.87 kg/m     Wt Readings from Last 3 Encounters:  12/30/22 191 lb 3.2 oz (86.7 kg)  12/24/22 188 lb 6.4 oz (85.5 kg)  11/26/22 186 lb (84.4 kg)     GEN:  Well nourished, well developed in no acute distress HEENT: Normal NECK: No JVD; No carotid bruits LYMPHATICS: No lymphadenopathy CARDIAC: RRR, no murmurs, rubs, gallops RESPIRATORY:  Clear to auscultation without rales, wheezing or rhonchi  ABDOMEN: Soft, non-tender, non-distended MUSCULOSKELETAL:  No edema; No deformity  SKIN: Warm and dry NEUROLOGIC:  Alert and oriented x 3 PSYCHIATRIC:  Normal affect    Risk Assessment/Risk Calculators:     CARPREG II Risk Prediction Index Score:  1.  The patient's risk for a primary cardiac event is 5%.   Modified World Health Organization [facility]) Classification of Maternal CV Risk   Class I         ASSESSMENT & PLAN:    Palpitations History of pulmonary embolism  Will place a monitor on the patient to make sure that everything is not playing a role here. Continue Lovenox per OB team.  Patient Instructions  Medication Instructions:  Your physician recommends that you continue  on your current medications as directed. Please refer to the Current Medication list given to you today.  *If you need a refill on your cardiac medications before your next appointment, please call your pharmacy*   Lab Work: None  Testing/Procedures: [place]- Long Term Monitor Instructions  Your physician has requested you wear a ZIO patch monitor for 14 days.  This is a single patch monitor. [employer] supplies one patch monitor per enrollment. Additional stickers are not available. Please do not apply patch if you will be having a Nuclear Stress Test,  Echocardiogram, Cardiac CT, MRI, or Chest Xray during the period you would be wearing the  monitor. The patch cannot be worn during these tests. You cannot remove and re-apply the  ZIO XT patch monitor.  Your ZIO patch monitor will be mailed 3 day USPS to your address on file. It may take 3-5 days  to receive your monitor after you have been enrolled.  Once you have received your monitor, please review the enclosed instructions. Your monitor  has already been registered assigning a specific monitor serial # to you.  Billing and Patient Assistance Program Information  We have supplied [employer] with any of your insurance information on file for billing purposes. [employer] offers a sliding scale Patient Assistance Program for patients that do not have  insurance, or whose insurance does not completely cover the cost of the ZIO monitor.  You must apply for the Patient Assistance Program to qualify for this discounted rate.  To apply, please call [employer] at [phone], select option 4, select option 2, ask to apply for  Patient Assistance Program. Theodore Demark will ask your household income, and how many people  are in your household. They will quote your out-of-pocket cost based on that information.  [employer] will also be able to set up a 35-month, interest-free payment plan if needed.  Applying the monitor   Shave hair from upper left chest.   Hold abrader disc by orange tab. Rub abrader in 40 strokes over the upper left chest as  indicated in your monitor instructions.  Clean area with 4 enclosed alcohol pads. Let dry.  Apply patch as indicated in monitor instructions. Patch will be placed under collarbone on left  side of chest with arrow pointing upward.  Rub patch adhesive wings for 2 minutes. Remove white label marked "1". Remove the white  label marked "2". Rub patch adhesive wings for 2 additional minutes.  While looking in a mirror, press and release button in center of patch. A small green light will  flash 3-4 times. This will be your only indicator that the monitor has been turned on.  Do not shower for the first 24 hours. You may shower after the first 24 hours.  Press the button if you feel a symptom. You will hear a small click. Record Date, Time and  Symptom in the Patient Logbook.  When you are ready to remove the patch, follow instructions on the last 2 pages of Patient  Logbook. Stick patch monitor onto the last page of Patient Logbook.  Place Patient Logbook in the blue and white box. Use locking tab on box and tape box closed  securely. The blue and white box has prepaid postage on it. Please place it in the mailbox as  soon as possible. Your physician should have your test results approximately 7 days after the  monitor has been mailed back to [facility].  Call [place] at [phone] if you have questions regarding  your ZIO XT patch monitor. Call them immediately if you see an orange light blinking on your  monitor.  If your monitor falls off in less than 4 days, contact our Monitor department at [phone].  If  your monitor becomes loose or falls off after 4 days call [employer] at [phone] for  suggestions on securing your monitor    Follow-Up: At [facility], you and your health needs are our priority.  As part of our continuing mission to provide you with  exceptional heart care, we have created designated Provider Care Teams.  These Care Teams include your primary Cardiologist (physician) and Advanced Practice Providers (APPs -  Physician Assistants and Nurse Practitioners) who all work together to provide you with the care you need, when you need it.  We recommend signing up for the patient portal called "MyChart".  Sign up information is provided on this After Visit Summary.  MyChart is used to connect with patients for Virtual Visits (Telemedicine).  Patients are able to view lab/test results, encounter notes, upcoming appointments, etc.  Non-urgent messages can be sent to your provider as well.   To learn more about what you can do with MyChart, go to [email or web address].    Your next appointment:   12-16 week(s)  Provider:   Berniece Salines, DO     Dispo:  No follow-ups on file.   Medication Adjustments/Labs and Tests Ordered: Current medicines are reviewed at length with the patient today.  Concerns regarding medicines are outlined above.  Tests Ordered: Orders Placed This Encounter  Procedures   LONG TERM MONITOR (3-14 DAYS)   EKG 12-Lead   Medication Changes: No orders of the defined types were placed in this encounter.

## 2022-12-30 NOTE — Patient Instructions (Signed)
Medication Instructions:  Your physician recommends that you continue on your current medications as directed. Please refer to the Current Medication list given to you today.  *If you need a refill on your cardiac medications before your next appointment, please call your pharmacy*   Lab Work: None   Testing/Procedures: [place] Monitor Instructions  Your physician has requested you wear a ZIO patch monitor for 14 days.  This is a single patch monitor. [employer] supplies one patch monitor per enrollment. Additional stickers are not available. Please do not apply patch if you will be having a Nuclear Stress Test,  Echocardiogram, Cardiac CT, MRI, or Chest Xray during the period you would be wearing the  monitor. The patch cannot be worn during these tests. You cannot remove and re-apply the  ZIO XT patch monitor.  Your ZIO patch monitor will be mailed 3 day USPS to your address on file. It may take 3-5 days  to receive your monitor after you have been enrolled.  Once you have received your monitor, please review the enclosed instructions. Your monitor  has already been registered assigning a specific monitor serial # to you.  Billing and Patient Assistance Program Information  We have supplied [employer] with any of your insurance information on file for billing purposes. [employer] offers a sliding scale Patient Assistance Program for patients that do not have  insurance, or whose insurance does not completely cover the cost of the ZIO monitor.  You must apply for the Patient Assistance Program to qualify for this discounted rate.  To apply, please call [employer] at [phone], select option 4, select option 2, ask to apply for  Patient Assistance Program. Theodore Demark will ask your household income, and how many people  are in your household. They will quote your out-of-pocket cost based on that information.  [employer] will also be able to set up a 59-month interest-free payment plan if  needed.  Applying the monitor   Shave hair from upper left chest.  Hold abrader disc by orange tab. Rub abrader in 40 strokes over the upper left chest as  indicated in your monitor instructions.  Clean area with 4 enclosed alcohol pads. Let dry.  Apply patch as indicated in monitor instructions. Patch will be placed under collarbone on left  side of chest with arrow pointing upward.  Rub patch adhesive wings for 2 minutes. Remove white label marked "1". Remove the white  label marked "2". Rub patch adhesive wings for 2 additional minutes.  While looking in a mirror, press and release button in center of patch. A small green light will  flash 3-4 times. This will be your only indicator that the monitor has been turned on.  Do not shower for the first 24 hours. You may shower after the first 24 hours.  Press the button if you feel a symptom. You will hear a small click. Record Date, Time and  Symptom in the Patient Logbook.  When you are ready to remove the patch, follow instructions on the last 2 pages of Patient  Logbook. Stick patch monitor onto the last page of Patient Logbook.  Place Patient Logbook in the blue and white box. Use locking tab on box and tape box closed  securely. The blue and white box has prepaid postage on it. Please place it in the mailbox as  soon as possible. Your physician should have your test results approximately 7 days after the  monitor has been mailed back to [facility]  Call [place] at [phone] if you have questions regarding  your ZIO XT patch monitor. Call them immediately if you see an orange light blinking on your  monitor.  If your monitor falls off in less than 4 days, contact our Monitor department at [phone].  If your monitor becomes loose or falls off after 4 days call [employer] at [phone] for  suggestions on securing your monitor    Follow-Up: At [facility], you and your health needs are  our priority.  As part of our continuing mission to provide you with exceptional heart care, we have created designated Provider Care Teams.  These Care Teams include your primary Cardiologist (physician) and Advanced Practice Providers (APPs -  Physician Assistants and Nurse Practitioners) who all work together to provide you with the care you need, when you need it.  We recommend signing up for the patient portal called "MyChart".  Sign up information is provided on this After Visit Summary.  MyChart is used to connect with patients for Virtual Visits (Telemedicine).  Patients are able to view lab/test results, encounter notes, upcoming appointments, etc.  Non-urgent messages can be sent to your provider as well.   To learn more about what you can do with MyChart, go to [email or web address].    Your next appointment:   12-16 week(s)  Provider:   Berniece Salines, DO

## 2022-12-30 NOTE — Progress Notes (Unsigned)
Enrolled for [employer] to mail a ZIO XT long term holter monitor to the patients address on file.  

## 2023-01-12 DIAGNOSIS — R002 Palpitations: Secondary | ICD-10-CM

## 2023-01-21 ENCOUNTER — Ambulatory Visit (INDEPENDENT_AMBULATORY_CARE_PROVIDER_SITE_OTHER): Payer: Managed Care, Other (non HMO) | Admitting: Advanced Practice Midwife

## 2023-01-21 ENCOUNTER — Encounter: Payer: Self-pay | Admitting: Advanced Practice Midwife

## 2023-01-21 VITALS — BP 119/56 | HR 68 | Wt 192.0 lb

## 2023-01-21 DIAGNOSIS — Z3482 Encounter for supervision of other normal pregnancy, second trimester: Secondary | ICD-10-CM

## 2023-01-21 DIAGNOSIS — Z86711 Personal history of pulmonary embolism: Secondary | ICD-10-CM

## 2023-01-21 DIAGNOSIS — Z3A22 22 weeks gestation of pregnancy: Secondary | ICD-10-CM

## 2023-01-21 NOTE — Progress Notes (Signed)
HIGH-RISK PREGNANCY VISIT Patient name: [name] MRN [id number]  Date of birth: 10/26/1985 Chief Complaint:   Routine Prenatal Visit  History of Present Illness:   [name] is a 37 y.o. [id number] female at [redacted]w[redacted]d with an Estimated Date of Delivery: 05/23/23 being seen today for ongoing management of a high-risk pregnancy complicated by hx PE, on lovenox.    Today she reports no complaints. Contractions: Not present. Vag. Bleeding: None.  Movement: Present. denies leaking of fluid.      11/26/2022   11:36 AM 02/21/2020   11:52 AM  Depression screen PHQ 2/9  Decreased Interest 0 0  Down, Depressed, Hopeless 0 0  PHQ - 2 Score 0 0  Altered sleeping 0 0  Tired, decreased energy 0 2  Change in appetite 0 2  Feeling bad or failure about yourself  0 0  Trouble concentrating 0 0  Moving slowly or fidgety/restless 0 0  Suicidal thoughts 0 0  PHQ-9 Score 0 4  Difficult doing work/chores  Not difficult at all        11/26/2022   11:36 AM 02/21/2020   11:52 AM  GAD 7 : Generalized Anxiety Score  Nervous, Anxious, on Edge 0 0  Control/stop worrying 0 0  Worry too much - different things 1 2  Trouble relaxing 0 2  Restless 0 2  Easily annoyed or irritable 0 0  Afraid - awful might happen 0 0  Total GAD 7 Score 1 6  Anxiety Difficulty  Not difficult at all     Review of Systems:   Pertinent items are noted in HPI Denies abnormal vaginal discharge w/ itching/odor/irritation, headaches, visual changes, shortness of breath, chest pain, abdominal pain, severe nausea/vomiting, or problems with urination or bowel movements unless otherwise stated above. Pertinent History Reviewed:  Reviewed past medical,surgical, social, obstetrical and family history.  Reviewed problem list, medications and allergies. Physical Assessment:   Vitals:   01/21/23 0912  BP: (!) 119/56  Pulse: 68  Weight: 192 lb (87.1 kg)  Body mass index is 34.01 kg/m.           Physical Examination:    General appearance: alert, well appearing, and in no distress  Mental status: alert, oriented to person, place, and time  Skin: warm & dry   Extremities: Edema: None    Cardiovascular: normal heart rate noted  Respiratory: normal respiratory effort, no distress  Abdomen: gravid, soft, non-tender  Pelvic: Cervical exam deferred         Fetal Status: Fetal Heart Rate (bpm): 148   Movement: Present    Fetal Surveillance Testing today: doppler   Chaperone: N/A    No results found for this or any previous visit (from the past 24 hour(s)).  Assessment & Plan:  High-risk pregnancy: [id number] at [redacted]w[redacted]d with an Estimated Date of Delivery: 05/23/23   1. [redacted] weeks gestation of pregnancy   2. Supervision of normal intrauterine pregnancy in multigravida, second trimester EDC changed to 8/18 (LMP was recorded as date it ended, not started.  Corrected LMP matches exactly w/15 week [place] EDC)  3. History of pulmonary embolus (PE) Continue lovenox    Meds: No orders of the defined types were placed in this encounter.   Orders: No orders of the defined types were placed in this encounter.    Labs/procedures today: none   Reviewed: Preterm labor symptoms and general obstetric precautions including but not limited to vaginal bleeding, contractions, leaking of fluid and  fetal movement were reviewed in detail with the patient.  All questions were answered. Does have home bp cuff. Office bp cuff given: not applicable. Check bp weekly, let us know if consistently >140 and/or >90.  Follow-up: Return in about 4 weeks (around 02/18/2023) for PN2/LROB.   Future Appointments  Date Time Provider Department Center  04/16/2023  8:40 AM Tobb, Lavona [place]ound, DO [facility] None    No orders of the defined types were placed in this encounter.  Jacklyn Shell , DNP, CNM  Medical Group 01/21/2023 9:34 AM

## 2023-01-21 NOTE — Patient Instructions (Signed)

## 2023-02-18 ENCOUNTER — Encounter: Payer: Self-pay | Admitting: Advanced Practice Midwife

## 2023-02-18 ENCOUNTER — Ambulatory Visit: Payer: Managed Care, Other (non HMO) | Admitting: Advanced Practice Midwife

## 2023-02-18 ENCOUNTER — Other Ambulatory Visit: Payer: Managed Care, Other (non HMO)

## 2023-02-18 VITALS — BP 107/74 | HR 91 | Wt 196.0 lb

## 2023-02-18 DIAGNOSIS — Z131 Encounter for screening for diabetes mellitus: Secondary | ICD-10-CM

## 2023-02-18 DIAGNOSIS — O09522 Supervision of elderly multigravida, second trimester: Secondary | ICD-10-CM

## 2023-02-18 DIAGNOSIS — Z3A27 27 weeks gestation of pregnancy: Secondary | ICD-10-CM

## 2023-02-18 DIAGNOSIS — Z3480 Encounter for supervision of other normal pregnancy, unspecified trimester: Secondary | ICD-10-CM

## 2023-02-18 DIAGNOSIS — O0992 Supervision of high risk pregnancy, unspecified, second trimester: Secondary | ICD-10-CM

## 2023-02-18 DIAGNOSIS — Z86711 Personal history of pulmonary embolism: Secondary | ICD-10-CM

## 2023-02-18 NOTE — Patient Instructions (Signed)
[name], I greatly value your feedback.  If you receive a survey following your visit with [place] today, we appreciate you taking the time to fill it out.  Thanks, Cathie Beams, DNP, CNM  [facility] HAS MOV[facility]!!! It is now [facility] at [facility] ([address] [place], [place] [address]) Entrance located off of [address] Free 24/7 valet parking   Go to [email or web address] to register for FREE online childbirth classes    Call the office [phone]) or go to [facility] if: You begin to have strong, frequent contractions Your water breaks.  Sometimes it is a big gush of fluid, sometimes it is just a trickle that keeps getting your panties wet or running down your legs You have vaginal bleeding.  It is normal to have a small amount of spotting if your cervix was checked.  You don't feel your baby moving like normal.  If you don't, get you something to eat and drink and lay down and focus on feeling your baby move.  You should feel at least 10 movements in 2 hours.  If you don't, you should call the office or go to [facility].   Home Blood Pressure Monitoring for Patients   Your provider has recommended that you check your blood pressure (BP) at least once a week at home. If you do not have a blood pressure cuff at home, one will be provided for you. Contact your provider if you have not received your monitor within 1 week.   Helpful Tips for Accurate Home Blood Pressure Checks  Don't smoke, exercise, or drink caffeine 30 minutes before checking your BP Use the restroom before checking your BP (a full bladder can raise your pressure) Relax in a comfortable upright chair Feet on the ground Left arm resting comfortably on a flat surface at the level of your heart Legs uncrossed Back supported Sit quietly and don't talk Place the cuff on your bare arm Adjust snuggly, so that only two fingertips can fit between your skin and the top of  the cuff Check 2 readings separated by at least one minute Keep a log of your BP readings For a visual, please reference this diagram: http://ccnc.care/bpdiagram  Provider Name: [facility]     Phone: [phone]  Zone 1: ALL CLEAR  Continue to monitor your symptoms:  BP reading is less than 140 (top number) or less than 90 (bottom number)  No right upper stomach pain No headaches or seeing spots No feeling nauseated or throwing up No swelling in face and hands  Zone 2: CAUTION Call your doctor's office for any of the following:  BP reading is greater than 140 (top number) or greater than 90 (bottom number)  Stomach pain under your ribs in the middle or right side Headaches or seeing spots Feeling nauseated or throwing up Swelling in face and hands  Zone 3: EMERGENCY  Seek immediate medical care if you have any of the following:  BP reading is greater than160 (top number) or greater than 110 (bottom number) Severe headaches not improving with Tylenol Serious difficulty catching your breath Any worsening symptoms from Zone 2

## 2023-02-18 NOTE — Progress Notes (Signed)
HIGH-RISK PREGNANCY VISIT Patient name: [name] MRN [id number]  Date of birth: January 12, 1986 Chief Complaint:   Routine Prenatal Visit (PN2/ information given tdap)  History of Present Illness:   [name] is a 37 y.o. [id number] female at [redacted]w[redacted]d with an Estimated Date of Delivery: 05/16/23 being seen today for ongoing management of a high-risk pregnancy complicated by Hx PE, on lovenox.    Today she reports some hip and groin pain. Wants to see a chiropractor. Contractions: Not present.  .  Movement: Present. denies leaking of fluid.      11/26/2022   11:36 AM 02/21/2020   11:52 AM  Depression screen PHQ 2/9  Decreased Interest 0 0  Down, Depressed, Hopeless 0 0  PHQ - 2 Score 0 0  Altered sleeping 0 0  Tired, decreased energy 0 2  Change in appetite 0 2  Feeling bad or failure about yourself  0 0  Trouble concentrating 0 0  Moving slowly or fidgety/restless 0 0  Suicidal thoughts 0 0  PHQ-9 Score 0 4  Difficult doing work/chores  Not difficult at all        11/26/2022   11:36 AM 02/21/2020   11:52 AM  GAD 7 : Generalized Anxiety Score  Nervous, Anxious, on Edge 0 0  Control/stop worrying 0 0  Worry too much - different things 1 2  Trouble relaxing 0 2  Restless 0 2  Easily annoyed or irritable 0 0  Afraid - awful might happen 0 0  Total GAD 7 Score 1 6  Anxiety Difficulty  Not difficult at all     Review of Systems:   Pertinent items are noted in HPI Denies abnormal vaginal discharge w/ itching/odor/irritation, headaches, visual changes, shortness of breath, chest pain, abdominal pain, severe nausea/vomiting, or problems with urination or bowel movements unless otherwise stated above. Pertinent History Reviewed:  Reviewed past medical,surgical, social, obstetrical and family history.  Reviewed problem list, medications and allergies. Physical Assessment:   Vitals:   02/18/23 0907  BP: 107/74  Pulse: 91  Weight: 196 lb (88.9 kg)  Body mass index is 34.72  kg/m.           Physical Examination:   General appearance: alert, well appearing, and in no distress  Mental status: alert, oriented to person, place, and time  Skin: warm & dry   Extremities: Edema: None    Cardiovascular: normal heart rate noted  Respiratory: normal respiratory effort, no distress  Abdomen: gravid, soft, non-tender  Pelvic: Cervical exam deferred         Fetal Status: Fetal Heart Rate (bpm): 145 Fundal Height: 27 cm Movement: Present    Fetal Surveillance Testing today: PN2   Chaperone: N/A    No results found for this or any previous visit (from the past 24 hour(s)).  Assessment & Plan:  High-risk pregnancy: [id number] at [redacted]w[redacted]d with an Estimated Date of Delivery: 05/16/23  2. Supervision of high risk intrauterine pregnancy in multigravida, second trimester   3. Multigravida of advanced maternal age in second trimester No extra testing, <40  4. History of pulmonary embolus (PE) Continue lovenox    Meds: No orders of the defined types were placed in this encounter.   Orders: No orders of the defined types were placed in this encounter.    Labs/procedures today: PN2   Reviewed: Preterm labor symptoms and general obstetric precautions including but not limited to vaginal bleeding, contractions, leaking of fluid and fetal movement were  reviewed in detail with the patient.  All questions were answered. Does have home bp cuff. Office bp cuff given: not applicable. Check bp weekly, let us know if consistently >140 and/or >90.  Follow-up: Return in about 3 weeks (around 03/11/2023) for HROB w/CNM or MD.   Future Appointments  Date Time Provider Department Center  04/16/2023  8:40 AM Tobb, Lavona Mound, DO [facility] None    No orders of the defined types were placed in this encounter.  Jacklyn Shell , DNP, CNM [facility] 02/18/2023 9:51 AM

## 2023-02-19 LAB — GLUCOSE TOLERANCE, 2 HOURS W/ 1HR
Glucose, 1 hour: 186 mg/dL — ABNORMAL HIGH (ref 70–179)
Glucose, 2 hour: 124 mg/dL (ref 70–152)
Glucose, Fasting: 86 mg/dL (ref 70–91)

## 2023-02-19 LAB — CBC
Hematocrit: 33.2 % — ABNORMAL LOW (ref 34.0–46.6)
Hemoglobin: 11 g/dL — ABNORMAL LOW (ref 11.1–15.9)
MCH: 30.2 pg (ref 26.6–33.0)
MCHC: 33.1 g/dL (ref 31.5–35.7)
MCV: 91 fL (ref 79–97)
Platelets: 224 10*3/uL (ref 150–450)
RBC: 3.64 x10E6/uL — ABNORMAL LOW (ref 3.77–5.28)
RDW: 12.1 % (ref 11.7–15.4)
WBC: 8.1 10*3/uL (ref 3.4–10.8)

## 2023-02-19 LAB — HIV ANTIBODY (ROUTINE TESTING W REFLEX): HIV Screen 4th Generation wRfx: NONREACTIVE

## 2023-02-19 LAB — ANTIBODY SCREEN: Antibody Screen: NEGATIVE

## 2023-02-19 LAB — RPR: RPR Ser Ql: NONREACTIVE

## 2023-02-20 ENCOUNTER — Encounter: Payer: Self-pay | Admitting: Advanced Practice Midwife

## 2023-02-20 DIAGNOSIS — O24419 Gestational diabetes mellitus in pregnancy, unspecified control: Secondary | ICD-10-CM | POA: Insufficient documentation

## 2023-02-23 ENCOUNTER — Other Ambulatory Visit: Payer: Self-pay | Admitting: *Deleted

## 2023-02-23 ENCOUNTER — Encounter: Payer: Self-pay | Admitting: Advanced Practice Midwife

## 2023-02-23 DIAGNOSIS — O24410 Gestational diabetes mellitus in pregnancy, diet controlled: Secondary | ICD-10-CM

## 2023-02-23 MED ORDER — ACCU-CHEK SOFTCLIX LANCETS MISC: 12 refills | Status: DC

## 2023-02-23 MED ORDER — ACCU-CHEK GUIDE VI STRP: ORAL_STRIP | 12 refills | Status: DC

## 2023-02-23 MED ORDER — ACCU-CHEK GUIDE ME W/DEVICE KIT: 1.0000 | PACK | Freq: Four times a day (QID) | 0 refills | Status: DC

## 2023-02-24 ENCOUNTER — Encounter: Payer: Self-pay | Admitting: Nutrition

## 2023-02-24 ENCOUNTER — Encounter: Payer: Managed Care, Other (non HMO) | Attending: Advanced Practice Midwife | Admitting: Nutrition

## 2023-02-24 DIAGNOSIS — O24410 Gestational diabetes mellitus in pregnancy, diet controlled: Secondary | ICD-10-CM

## 2023-02-24 NOTE — Progress Notes (Signed)
Patient was seen on 02-24-23 for Gestational Diabetes self-management class at the [facility]. The following learning objectives were met by the patient during this course:  States the definition of Gestational Diabetes States why dietary management is important in controlling blood glucose Describes the effects each nutrient has on blood glucose levels Demonstrates ability to create a balanced meal plan Demonstrates carbohydrate counting  States when to check blood glucose levels Demonstrates proper blood glucose monitoring techniques States the effect of stress and exercise on blood glucose levels States the importance of limiting caffeine and abstaining from alcohol and smoking  Just picked up her glucometer today.  Goals Focus on eating 3 meals and 3 small snacks per day at times discussed. Cut out sweets and processed foods. Focus on more whole plant based foods. Patient instructed to monitor glucose levels: FBS: 60 - <90 1 hour: <140 2 hour: <120  *Patient received handouts: Nutrition Diabetes and Pregnancy Carbohydrate Counting List  Patient will be seen for follow-up in 1 week.

## 2023-02-24 NOTE — Patient Instructions (Signed)
Goals Focus on eating 3 meals and 3 small snacks per day at times discussed. Cut out sweets and processed foods. Focus on more whole plant based foods. Patient instructed to monitor glucose levels: FBS: 60 - <90 1 hour: <140 2 hour: <120

## 2023-03-02 ENCOUNTER — Encounter: Payer: Managed Care, Other (non HMO) | Attending: Family Medicine | Admitting: Nutrition

## 2023-03-02 ENCOUNTER — Encounter: Payer: Self-pay | Admitting: Nutrition

## 2023-03-02 VITALS — Wt 196.0 lb

## 2023-03-02 DIAGNOSIS — O24410 Gestational diabetes mellitus in pregnancy, diet controlled: Secondary | ICD-10-CM | POA: Diagnosis present

## 2023-03-02 NOTE — Progress Notes (Signed)
Patient was seen on 03-02-23 for Gestational Diabetes self-management class follow up.  She cut out a lot of sweets. Choosing more fruit and healthier foods. FBS 79-91, 2 hrs after meals 83-126 mg/dl.  Goals previously set:  Focus on eating 3 meals and 3 small snacks per day at times discussed. Cut out sweets and processed foods.-done. Eating more fruit now. Focus on more whole plant based foods.- working on it. Patient instructed to monitor glucose levels: doing well. FBS: 60 - <90 1 hour: <140 2 hour: <120  IF BS become elevated, contact your OBGYN MD.    F/u PRN

## 2023-03-11 ENCOUNTER — Encounter: Payer: Self-pay | Admitting: Obstetrics & Gynecology

## 2023-03-11 ENCOUNTER — Ambulatory Visit: Payer: Managed Care, Other (non HMO) | Admitting: Obstetrics & Gynecology

## 2023-03-11 VITALS — BP 108/64 | HR 74 | Wt 199.0 lb

## 2023-03-11 DIAGNOSIS — O24410 Gestational diabetes mellitus in pregnancy, diet controlled: Secondary | ICD-10-CM

## 2023-03-11 DIAGNOSIS — Z3A30 30 weeks gestation of pregnancy: Secondary | ICD-10-CM

## 2023-03-11 DIAGNOSIS — O0993 Supervision of high risk pregnancy, unspecified, third trimester: Secondary | ICD-10-CM

## 2023-03-11 NOTE — Progress Notes (Signed)
HIGH-RISK PREGNANCY VISIT Patient name: [name] MRN [id number]  Date of birth: [birth year] Chief Complaint:   Routine Prenatal Visit  History of Present Illness:   [name] is a 37 y.o. [id number] female at [redacted]w[redacted]d with an Estimated Date of Delivery: 05/16/23 being seen today for ongoing management of a high-risk pregnancy complicated by advanced maternal age >=40yo and diabetes mellitus A1DM.    Today she reports no complaints. Contractions: Not present. Vag. Bleeding: None.  Movement: Present. denies leaking of fluid.      11/26/2022   11:36 AM 02/21/2020   11:52 AM  Depression screen PHQ 2/9  Decreased Interest 0 0  Down, Depressed, Hopeless 0 0  PHQ - 2 Score 0 0  Altered sleeping 0 0  Tired, decreased energy 0 2  Change in appetite 0 2  Feeling bad or failure about yourself  0 0  Trouble concentrating 0 0  Moving slowly or fidgety/restless 0 0  Suicidal thoughts 0 0  PHQ-9 Score 0 4  Difficult doing work/chores  Not difficult at all        11/26/2022   11:36 AM 02/21/2020   11:52 AM  GAD 7 : Generalized Anxiety Score  Nervous, Anxious, on Edge 0 0  Control/stop worrying 0 0  Worry too much - different things 1 2  Trouble relaxing 0 2  Restless 0 2  Easily annoyed or irritable 0 0  Afraid - awful might happen 0 0  Total GAD 7 Score 1 6  Anxiety Difficulty  Not difficult at all     Review of Systems:   Pertinent items are noted in HPI Denies abnormal vaginal discharge w/ itching/odor/irritation, headaches, visual changes, shortness of breath, chest pain, abdominal pain, severe nausea/vomiting, or problems with urination or bowel movements unless otherwise stated above. Pertinent History Reviewed:  Reviewed past medical,surgical, social, obstetrical and family history.  Reviewed problem list, medications and allergies. Physical Assessment:   Vitals:   03/11/23 1204  BP: 108/64  Pulse: 74  Weight: 199 lb (90.3 kg)  Body mass index is 35.25 kg/m.            Physical Examination:   General appearance: alert, well appearing, and in no distress  Mental status: alert, oriented to person, place, and time  Skin: warm & dry   Extremities: Edema: None    Cardiovascular: normal heart rate noted  Respiratory: normal respiratory effort, no distress  Abdomen: gravid, soft, non-tender  Pelvic: Cervical exam deferred         Fetal Status:     Movement: Present    Fetal Surveillance Testing today:    Chaperone:     No results found for this or any previous visit (from the past 24 hour(s)).  Assessment & Plan:  High-risk pregnancy: [id number] at [redacted]w[redacted]d with an Estimated Date of Delivery: 05/16/23      ICD-10-CM   1. Supervision of high risk pregnancy in third trimester  O09.93     2. Diet controlled gestational diabetes mellitus (GDM) in third trimester  O24.410         Meds: No orders of the defined types were placed in this encounter.   Orders: No orders of the defined types were placed in this encounter.    Labs/procedures today: none  Treatment Plan:  routine efw 36 weeks    Follow-up: Return in about 2 weeks (around 03/25/2023) for HROB.   Future Appointments  Date Time Provider Department Center  04/16/2023  8:40 AM Tobb, Kardie, DO [facility] None    No orders of the defined types were placed in this encounter.  Lazaro Arms  Attending Physician for the Center for [facility] 03/11/2023 12:34 PM

## 2023-03-17 ENCOUNTER — Encounter: Payer: Self-pay | Admitting: Advanced Practice Midwife

## 2023-03-18 ENCOUNTER — Encounter: Payer: Self-pay | Admitting: Advanced Practice Midwife

## 2023-03-18 ENCOUNTER — Ambulatory Visit: Payer: Managed Care, Other (non HMO) | Admitting: Obstetrics & Gynecology

## 2023-03-18 VITALS — BP 100/61 | HR 73 | Wt 198.0 lb

## 2023-03-18 DIAGNOSIS — Z331 Pregnant state, incidental: Secondary | ICD-10-CM

## 2023-03-18 DIAGNOSIS — Z3A31 31 weeks gestation of pregnancy: Secondary | ICD-10-CM

## 2023-03-18 DIAGNOSIS — O0992 Supervision of high risk pregnancy, unspecified, second trimester: Secondary | ICD-10-CM

## 2023-03-18 DIAGNOSIS — O0993 Supervision of high risk pregnancy, unspecified, third trimester: Secondary | ICD-10-CM

## 2023-03-18 DIAGNOSIS — Z1389 Encounter for screening for other disorder: Secondary | ICD-10-CM

## 2023-03-18 LAB — POCT URINALYSIS DIPSTICK OB
Blood, UA: NEGATIVE
Glucose, UA: NEGATIVE
Leukocytes, UA: NEGATIVE
Nitrite, UA: NEGATIVE
POC,PROTEIN,UA: NEGATIVE

## 2023-03-18 NOTE — Progress Notes (Signed)
HIGH-RISK PREGNANCY VISIT Patient name: [name] MRN [id number]  Date of birth: 20-Jan-1986 Chief Complaint:   Routine Prenatal Visit (Work-in ob , check to see if in labor)  History of Present Illness:   [name] is a 37 y.o. [id number] female at [redacted]w[redacted]d with an Estimated Date of Delivery: 05/16/23 being seen today for ongoing management of a high-risk pregnancy complicated by GDM + Hx of PE on lovenonx 40  She presents today complaining of intermittent discharge being moist No gushes No bleeding No contractions Good FM Has a lot of cervical mucous  Wet prep:negative for yeast, BV and trichomonas, a good deal of WBC with abundant cervical mucous on exam     Today she reports as above. Contractions: Irregular.  .  Movement: Present. denies leaking of fluid.      11/26/2022   11:36 AM 02/21/2020   11:52 AM  Depression screen PHQ 2/9  Decreased Interest 0 0  Down, Depressed, Hopeless 0 0  PHQ - 2 Score 0 0  Altered sleeping 0 0  Tired, decreased energy 0 2  Change in appetite 0 2  Feeling bad or failure about yourself  0 0  Trouble concentrating 0 0  Moving slowly or fidgety/restless 0 0  Suicidal thoughts 0 0  PHQ-9 Score 0 4  Difficult doing work/chores  Not difficult at all        11/26/2022   11:36 AM 02/21/2020   11:52 AM  GAD 7 : Generalized Anxiety Score  Nervous, Anxious, on Edge 0 0  Control/stop worrying 0 0  Worry too much - different things 1 2  Trouble relaxing 0 2  Restless 0 2  Easily annoyed or irritable 0 0  Afraid - awful might happen 0 0  Total GAD 7 Score 1 6  Anxiety Difficulty  Not difficult at all     Review of Systems:   Pertinent items are noted in HPI Denies abnormal vaginal discharge w/ itching/odor/irritation, headaches, visual changes, shortness of breath, chest pain, abdominal pain, severe nausea/vomiting, or problems with urination or bowel movements unless otherwise stated above. Pertinent History Reviewed:  Reviewed past  medical,surgical, social, obstetrical and family history.  Reviewed problem list, medications and allergies. Physical Assessment:   Vitals:   03/18/23 1137  BP: 100/61  Pulse: 73  Weight: 198 lb (89.8 kg)  Body mass index is 35.07 kg/m.           Physical Examination:   General appearance: alert, well appearing, and in no distress  Mental status: alert, oriented to person, place, and time  Skin: warm & dry   Extremities: Edema: Trace    Cardiovascular: normal heart rate noted  Respiratory: normal respiratory effort, no distress  Abdomen: gravid, soft, non-tender  Pelvic:  visually negative          Fetal Status:     Movement: Present    Fetal Surveillance Testing today: FHR 155   Chaperone: Latisha Cresenzo    Results for orders placed or performed in visit on 03/18/23 (from the past 24 hour(s))  POC Urinalysis Dipstick OB   Collection Time: 03/18/23 12:29 PM  Result Value Ref Range   Color, UA     Clarity, UA     Glucose, UA Negative Negative   Bilirubin, UA     Ketones, UA trace    Spec Grav, UA     Blood, UA negative    pH, UA     POC,PROTEIN,UA Negative  Negative, Trace, Small (1+), Moderate (2+), Large (3+), 4+   Urobilinogen, UA     Nitrite, UA negative    Leukocytes, UA Negative Negative   Appearance     Odor      Assessment & Plan:  High-risk pregnancy: [id number] at [redacted]w[redacted]d with an Estimated Date of Delivery: 05/16/23      ICD-10-CM   1. High-risk pregnancy in second trimester  O09.92     2. Pregnant state, incidental  Z33.1 POC Urinalysis Dipstick OB    3. Screening for genitourinary condition  Z13.89 POC Urinalysis Dipstick OB      Normal cervical mucous no vaginal infection she can use boric acid suppositories if desired intermittently  Meds: No orders of the defined types were placed in this encounter.   Orders:  Orders Placed This Encounter  Procedures   POC Urinalysis Dipstick OB     Labs/procedures today: wet prep  Treatment Plan:  keep  scheduled    Follow-up: Return for keep scheduled.   Future Appointments  Date Time Provider Department Center  04/16/2023  8:40 AM Tobb, Lavona [place]ound, DO [facility] None    Orders Placed This Encounter  Procedures   POC Urinalysis Dipstick OB   Lazaro Arms  Attending Physician for the Center for [facility] 03/18/2023 12:41 PM

## 2023-03-25 ENCOUNTER — Encounter: Payer: Self-pay | Admitting: Obstetrics & Gynecology

## 2023-03-25 ENCOUNTER — Encounter: Payer: Managed Care, Other (non HMO) | Admitting: Obstetrics and Gynecology

## 2023-03-25 ENCOUNTER — Ambulatory Visit: Payer: Managed Care, Other (non HMO) | Admitting: Obstetrics & Gynecology

## 2023-03-25 VITALS — BP 108/59 | HR 86 | Wt 201.0 lb

## 2023-03-25 DIAGNOSIS — O0993 Supervision of high risk pregnancy, unspecified, third trimester: Secondary | ICD-10-CM

## 2023-03-25 DIAGNOSIS — Z86711 Personal history of pulmonary embolism: Secondary | ICD-10-CM

## 2023-03-25 DIAGNOSIS — Z3A32 32 weeks gestation of pregnancy: Secondary | ICD-10-CM

## 2023-03-25 DIAGNOSIS — O24410 Gestational diabetes mellitus in pregnancy, diet controlled: Secondary | ICD-10-CM

## 2023-03-25 NOTE — Progress Notes (Signed)
HIGH-RISK PREGNANCY VISIT Patient name: [name] MRN [id number]  Date of birth: 1986/09/25 Chief Complaint:   Routine Prenatal Visit  History of Present Illness:   [name] is a 37 y.o. [id number] female at [redacted]w[redacted]d with an Estimated Date of Delivery: 05/16/23 being seen today for ongoing management of a high-risk pregnancy complicated by A1DM with good CBGs, hx of PE on lovenox 40.    Today she reports no complaints. Contractions: Irregular. Vag. Bleeding: None.  Movement: Present. denies leaking of fluid.      11/26/2022   11:36 AM 02/21/2020   11:52 AM  Depression screen PHQ 2/9  Decreased Interest 0 0  Down, Depressed, Hopeless 0 0  PHQ - 2 Score 0 0  Altered sleeping 0 0  Tired, decreased energy 0 2  Change in appetite 0 2  Feeling bad or failure about yourself  0 0  Trouble concentrating 0 0  Moving slowly or fidgety/restless 0 0  Suicidal thoughts 0 0  PHQ-9 Score 0 4  Difficult doing work/chores  Not difficult at all        11/26/2022   11:36 AM 02/21/2020   11:52 AM  GAD 7 : Generalized Anxiety Score  Nervous, Anxious, on Edge 0 0  Control/stop worrying 0 0  Worry too much - different things 1 2  Trouble relaxing 0 2  Restless 0 2  Easily annoyed or irritable 0 0  Afraid - awful might happen 0 0  Total GAD 7 Score 1 6  Anxiety Difficulty  Not difficult at all     Review of Systems:   Pertinent items are noted in HPI Denies abnormal vaginal discharge w/ itching/odor/irritation, headaches, visual changes, shortness of breath, chest pain, abdominal pain, severe nausea/vomiting, or problems with urination or bowel movements unless otherwise stated above. Pertinent History Reviewed:  Reviewed past medical,surgical, social, obstetrical and family history.  Reviewed problem list, medications and allergies. Physical Assessment:   Vitals:   03/25/23 0938  BP: (!) 108/59  Pulse: 86  Weight: 201 lb (91.2 kg)  Body mass index is 35.61 kg/m.            Physical Examination:   General appearance: alert, well appearing, and in no distress  Mental status: alert, oriented to person, place, and time  Skin: warm & dry   Extremities: Edema: None    Cardiovascular: normal heart rate noted  Respiratory: normal respiratory effort, no distress  Abdomen: gravid, soft, non-tender  Pelvic: Cervical exam deferred         Fetal Status: Fetal Heart Rate (bpm): 144 Fundal Height: 34 cm Movement: Present    Fetal Surveillance Testing today: FHR 144   Chaperone: N/A    No results found for this or any previous visit (from the past 24 hour(s)).  Assessment & Plan:  High-risk pregnancy: [id number] at [redacted]w[redacted]d with an Estimated Date of Delivery: 05/16/23      ICD-10-CM   1. High-risk pregnancy in third trimester  O09.93     2. Diet controlled gestational diabetes mellitus (GDM) in third trimester: good control  O24.410     3. History of pulmonary embolus (PE): on lovenox 40 daily  Z86.711         Meds: No orders of the defined types were placed in this encounter.   Orders: No orders of the defined types were placed in this encounter.    Labs/procedures today: none  Treatment Plan:  EFW 36 weeks IOL 40 weeks  if undelivered   Reviewed: Preterm labor symptoms and general obstetric precautions including but not limited to vaginal bleeding, contractions, leaking of fluid and fetal movement were reviewed in detail with the patient.  All questions were answered. Does have home bp cuff. Office bp cuff given: yes. Check bp weekly, let us know if consistently >140 and/or >90.  Follow-up: Return in about 2 weeks (around 04/08/2023) for HROB.   Future Appointments  Date Time Provider Department Center  04/16/2023  8:40 AM Tobb, Lavona [place]ound, DO [facility] None  04/19/2023 10:45 AM [facility] [place] [facility] None  04/19/2023 11:30 AM Cresenzo-Dishmon, Scarlette Calico, CNM [facility]    No orders of the defined types were placed in this encounter.  Lazaro Arms   Attending Physician for the Center for [facility] 03/25/2023 9:54 AM

## 2023-03-29 ENCOUNTER — Encounter: Payer: Self-pay | Admitting: Advanced Practice Midwife

## 2023-03-29 MED ORDER — CYCLOBENZAPRINE HCL 10 MG PO TABS: 10.0000 mg | ORAL_TABLET | Freq: Three times a day (TID) | ORAL | 1 refills | Status: DC | PRN

## 2023-04-09 ENCOUNTER — Ambulatory Visit: Payer: Managed Care, Other (non HMO) | Admitting: Obstetrics & Gynecology

## 2023-04-09 ENCOUNTER — Encounter: Payer: Self-pay | Admitting: Obstetrics & Gynecology

## 2023-04-09 VITALS — BP 105/67 | HR 75 | Wt 200.0 lb

## 2023-04-09 DIAGNOSIS — Z3A34 34 weeks gestation of pregnancy: Secondary | ICD-10-CM

## 2023-04-09 DIAGNOSIS — Z86711 Personal history of pulmonary embolism: Secondary | ICD-10-CM

## 2023-04-09 DIAGNOSIS — O0993 Supervision of high risk pregnancy, unspecified, third trimester: Secondary | ICD-10-CM

## 2023-04-09 DIAGNOSIS — Z23 Encounter for immunization: Secondary | ICD-10-CM | POA: Diagnosis not present

## 2023-04-09 DIAGNOSIS — O24410 Gestational diabetes mellitus in pregnancy, diet controlled: Secondary | ICD-10-CM

## 2023-04-09 NOTE — Progress Notes (Signed)
HIGH-RISK PREGNANCY VISIT Patient name: [name] MRN [id number]  Date of birth: 19-Apr-1986 Chief Complaint:   Routine Prenatal Visit  History of Present Illness:   [name] is a 37 y.o. [id number] female at [redacted]w[redacted]d with an Estimated Date of Delivery: 05/16/23 being seen today for ongoing management of a high-risk pregnancy complicated by diabetes mellitus A1DM under good control and hx of PE on lovenox 40 daily.    Today she reports no complaints. Contractions: Irritability. Vag. Bleeding: None.  Movement: Present. denies leaking of fluid.      11/26/2022   11:36 AM 02/21/2020   11:52 AM  Depression screen PHQ 2/9  Decreased Interest 0 0  Down, Depressed, Hopeless 0 0  PHQ - 2 Score 0 0  Altered sleeping 0 0  Tired, decreased energy 0 2  Change in appetite 0 2  Feeling bad or failure about yourself  0 0  Trouble concentrating 0 0  Moving slowly or fidgety/restless 0 0  Suicidal thoughts 0 0  PHQ-9 Score 0 4  Difficult doing work/chores  Not difficult at all        11/26/2022   11:36 AM 02/21/2020   11:52 AM  GAD 7 : Generalized Anxiety Score  Nervous, Anxious, on Edge 0 0  Control/stop worrying 0 0  Worry too much - different things 1 2  Trouble relaxing 0 2  Restless 0 2  Easily annoyed or irritable 0 0  Afraid - awful might happen 0 0  Total GAD 7 Score 1 6  Anxiety Difficulty  Not difficult at all     Review of Systems:   Pertinent items are noted in HPI Denies abnormal vaginal discharge w/ itching/odor/irritation, headaches, visual changes, shortness of breath, chest pain, abdominal pain, severe nausea/vomiting, or problems with urination or bowel movements unless otherwise stated above. Pertinent History Reviewed:  Reviewed past medical,surgical, social, obstetrical and family history.  Reviewed problem list, medications and allergies. Physical Assessment:   Vitals:   04/09/23 0906  BP: 105/67  Pulse: 75  Weight: 200 lb (90.7 kg)  Body mass index is  35.43 kg/m.           Physical Examination:   General appearance: alert, well appearing, and in no distress  Mental status: alert, oriented to person, place, and time  Skin: warm & dry   Extremities: Edema: None    Cardiovascular: normal heart rate noted  Respiratory: normal respiratory effort, no distress  Abdomen: gravid, soft, non-tender  Pelvic: Cervical exam deferred         Fetal Status: Fetal Heart Rate (bpm): 135 Fundal Height: 34 cm Movement: Present    Fetal Surveillance Testing today: FHR 135   Chaperone: N/A    No results found for this or any previous visit (from the past 24 hour(s)).  Assessment & Plan:  High-risk pregnancy: [id number] at [redacted]w[redacted]d with an Estimated Date of Delivery: 05/16/23      ICD-10-CM   1. High-risk pregnancy in third trimester  O09.93 Tdap vaccine greater than or equal to 7yo IM    2. Diet controlled gestational diabetes mellitus (GDM) in third trimester: good control  O24.410     3. History of pulmonary embolus (PE): on lovenox 40 daily  Z86.711     4. [redacted] weeks gestation of pregnancy  Z3A.34 Tdap vaccine greater than or equal to 7yo IM       Meds: No orders of the defined types were placed in this encounter.  Orders:  Orders Placed This Encounter  Procedures   Tdap vaccine greater than or equal to 7yo IM     Labs/procedures today: none  Treatment Plan:  EFW next visit    Follow-up: No follow-ups on file.   Future Appointments  Date Time Provider Department Center  04/16/2023  8:40 AM Tobb, Lavona [place]ound, DO [facility] None  04/22/2023  3:00 PM [facility] [place] [facility] None  04/22/2023  3:50 PM Myna Hidalgo, DO [facility]  04/29/2023  9:10 AM Jacklyn Shell, CNM [facility]  05/07/2023  9:10 AM Myna Hidalgo, DO [facility]  05/13/2023  9:10 AM Cresenzo-Dishmon, Scarlette Calico, CNM [facility]    Orders Placed This Encounter  Procedures   Tdap vaccine greater than or equal to 7yo IM   Lazaro Arms  Attending  Physician for the Center for [facility] 04/09/2023 9:28 AM

## 2023-04-16 ENCOUNTER — Encounter: Payer: Self-pay | Admitting: Cardiology

## 2023-04-16 ENCOUNTER — Ambulatory Visit: Payer: Managed Care, Other (non HMO) | Attending: Cardiology | Admitting: Cardiology

## 2023-04-16 VITALS — BP 108/62 | HR 81 | Ht 63.0 in | Wt 200.0 lb

## 2023-04-16 DIAGNOSIS — O09523 Supervision of elderly multigravida, third trimester: Secondary | ICD-10-CM

## 2023-04-16 DIAGNOSIS — Z3A35 35 weeks gestation of pregnancy: Secondary | ICD-10-CM

## 2023-04-16 NOTE — Progress Notes (Signed)
Cardio-Obstetrics Clinic  Follow Up Note   Date:  04/16/2023   ID:  [name], DOB 1986-02-01, MRN [id number]  PCP:  Assunta Found, MD   [facility] Providers Cardiologist:  Thomasene Ripple, DO  Electrophysiologist:  None        Referring MD: Assunta Found, MD   Chief Complaint: " I am doing well"   History of Present Illness:    [name] is a 37 y.o. female [G5P2022] who returns for follow up of palpitations.   Medical history includes history of pulmonary embolism she was on anticoagulation appropriately and then transition off, she is now back on Lovenox for this pregnancy.  She is now on PPI for GERD.   At her last visit she was experiencing intermittent palpitations. She wore a monitor which came back normal.   She is here today for a follow up visit. She is [redacted] weeks pregnant. No complaints.     Prior CV Studies Reviewed: The following studies were reviewed today: Reviewed zio monitor   Past Medical History:  Diagnosis Date   Family history of BRCA1 gene positive    Family history of breast cancer    History of pulmonary embolus (PE)     Past Surgical History:  Procedure Laterality Date   NO PAST SURGERIES     REMOVAL OF NON VAGINAL CONTRACEPTIVE DEVICE Right 06/07/2014   Procedure: REMOVAL OF DEEP IMPLANON IMPLANT, ULTRASOUND GUIDED, RIGHT UPPER ARM;  Surgeon: Tilda Burrow, MD;  Location: [facility];  Service: Gynecology;  Laterality: Right;      OB History     Gravida  5   Para  2   Term  2   Preterm      AB  2   Living  2      SAB  2   IAB      Ectopic      Multiple      Live Births  2               Current Medications: Current Meds  Medication Sig   Accu-Chek Softclix Lancets lancets Use as instructed to check blood sugar 4 times daily   Blood Glucose Monitoring Suppl (ACCU-CHEK GUIDE ME) w/Device KIT 1 each by Does not apply route 4 (four) times daily.   enoxaparin (LOVENOX) 40 MG/0.4ML injection Inject 0.4  mLs (40 mg total) into the skin daily.   glucose blood (ACCU-CHEK GUIDE) test strip Use as instructed to check blood sugar 4 times daily   omeprazole (PRILOSEC OTC) 20 MG tablet Take 1 tablet (20 mg total) by mouth daily.   Prenatal Vit-Fe Fumarate-FA (PRENATAL VITAMIN PO) Take by mouth.     Allergies:   Patient has no known allergies.   Social History   Socioeconomic History   Marital status: Married    Spouse name: Not on file   Number of children: Not on file   Years of education: Not on file   Highest education level: Not on file  Occupational History   Not on file  Tobacco Use   Smoking status: Never   Smokeless tobacco: Never  Vaping Use   Vaping status: Never Used  Substance and Sexual Activity   Alcohol use: No   Drug use: No   Sexual activity: Yes    Birth control/protection: None  Other Topics Concern   Not on file  Social History Narrative   Not on file   Social Determinants of Health  Financial Resource Strain: Medium Risk (11/26/2022)   Overall Financial Resource Strain (CARDIA)    Difficulty of Paying Living Expenses: Somewhat hard  Food Insecurity: No Food Insecurity (11/26/2022)   Hunger Vital Sign    Worried About Running Out of Food in the Last Year: Never true    Ran Out of Food in the Last Year: Never true  Transportation Needs: No Transportation Needs (11/26/2022)   PRAPARE - Administrator, Civil Service (Medical): No    Lack of Transportation (Non-Medical): No  Physical Activity: Sufficiently Active (11/26/2022)   Exercise Vital Sign    Days of Exercise per Week: 3 days    Minutes of Exercise per Session: 60 min  Stress: Stress Concern Present (11/26/2022)   [employer] of Occupational Health - Occupational Stress Questionnaire    Feeling of Stress : To some extent  Social Connections: Moderately Integrated (11/26/2022)   Social Connection and Isolation Panel [NHANES]    Frequency of Communication with Friends and Family: Twice  a week    Frequency of Social Gatherings with Friends and Family: Three times a week    Attends Religious Services: 1 to 4 times per year    Active Member of Clubs or Organizations: No    Attends Banker Meetings: Never    Marital Status: Married      Family History  Problem Relation Age of Onset   Diabetes Mother    Hypertension Mother    Breast cancer Mother 53   Breast cancer Sister    Cancer Sister        breast   Breast cancer Maternal Aunt 60   Stomach cancer Maternal Aunt    Stomach cancer Maternal Grandfather       ROS:   Please see the history of present illness.     All other systems reviewed and are negative.   Labs/EKG Reviewed:    EKG:   EKG was ordered today.    Recent Labs: 02/18/2023: Hemoglobin 11.0; Platelets 224   Recent Lipid Panel No results found for: "CHOL", "TRIG", "HDL", "CHOLHDL", "LDLCALC", "LDLDIRECT"  Physical Exam:    VS:  BP 108/62   Pulse 81   Ht 5\' 3"  (1.6 m)   Wt 200 lb (90.7 kg)   LMP 08/09/2022   SpO2 99%   BMI 35.43 kg/m     Wt Readings from Last 3 Encounters:  04/16/23 200 lb (90.7 kg)  04/09/23 200 lb (90.7 kg)  03/25/23 201 lb (91.2 kg)     GEN:  Well nourished, well developed in no acute distress HEENT: Normal NECK: No JVD; No carotid bruits LYMPHATICS: No lymphadenopathy CARDIAC: RRR, no murmurs, rubs, gallops RESPIRATORY:  Clear to auscultation without rales, wheezing or rhonchi  ABDOMEN: Soft, non-tender, non-distended MUSCULOSKELETAL:  No edema; No deformity  SKIN: Warm and dry NEUROLOGIC:  Alert and oriented x 3 PSYCHIATRIC:  Normal affect    Risk Assessment/Risk Calculators:     CARPREG II Risk Prediction Index Score:  1.  The patient's risk for a primary cardiac event is 5%.            ASSESSMENT & PLAN:    Advanced maternal age Palpiations - has resolved   Doing well from a cv standpoint.  Planned vaginal visit,  Follow up will be virtual postpartum 12 weeks.   Patient  Instructions  Medication Instructions:  No changes   *If you need a refill on your cardiac medications before your next appointment, please  call your pharmacy*   Lab Work:  Not needed   Testing/Procedures: Not needed   Follow-Up: At [facility], you and your health needs are our priority.  As part of our continuing mission to provide you with exceptional heart care, we have created designated Provider Care Teams.  These Care Teams include your primary Cardiologist (physician) and Advanced Practice Providers (APPs -  Physician Assistants and Nurse Practitioners) who all work together to provide you with the care you need, when you need it.     Your next appointment:   12 week(s) postpart.   The format for your next appointment:   Virtual Visit   Provider:   Thomasene Ripple, DO    Other Instructions   Continue   blood pressure  reading   Dispo:  Return in about 12 weeks (around 07/09/2023).   Medication Adjustments/Labs and Tests Ordered: Current medicines are reviewed at length with the patient today.  Concerns regarding medicines are outlined above.  Tests Ordered: No orders of the defined types were placed in this encounter.  Medication Changes: No orders of the defined types were placed in this encounter.

## 2023-04-16 NOTE — Patient Instructions (Addendum)
Medication Instructions:  No changes   *If you need a refill on your cardiac medications before your next appointment, please call your pharmacy*   Lab Work:  Not needed   Testing/Procedures: Not needed   Follow-Up: At [facility], you and your health needs are our priority.  As part of our continuing mission to provide you with exceptional heart care, we have created designated Provider Care Teams.  These Care Teams include your primary Cardiologist (physician) and Advanced Practice Providers (APPs -  Physician Assistants and Nurse Practitioners) who all work together to provide you with the care you need, when you need it.     Your next appointment:   12 week(s) postpart.   The format for your next appointment:   Virtual Visit   Provider:   Thomasene Ripple, DO    Other Instructions   Continue   blood pressure  reading

## 2023-04-19 ENCOUNTER — Other Ambulatory Visit: Payer: Managed Care, Other (non HMO)

## 2023-04-19 ENCOUNTER — Encounter: Payer: Managed Care, Other (non HMO) | Admitting: Advanced Practice Midwife

## 2023-04-21 ENCOUNTER — Other Ambulatory Visit: Payer: Self-pay | Admitting: Obstetrics & Gynecology

## 2023-04-21 DIAGNOSIS — O24410 Gestational diabetes mellitus in pregnancy, diet controlled: Secondary | ICD-10-CM

## 2023-04-21 DIAGNOSIS — O88219 Thromboembolism in pregnancy, unspecified trimester: Secondary | ICD-10-CM

## 2023-04-22 ENCOUNTER — Other Ambulatory Visit (HOSPITAL_COMMUNITY)
Admission: RE | Admit: 2023-04-22 | Discharge: 2023-04-22 | Disposition: A | Discharge status: Home / Self Care | Payer: Managed Care, Other (non HMO) | Source: Ambulatory Visit | Attending: Obstetrics & Gynecology | Admitting: Obstetrics & Gynecology

## 2023-04-22 ENCOUNTER — Ambulatory Visit (INDEPENDENT_AMBULATORY_CARE_PROVIDER_SITE_OTHER): Payer: Managed Care, Other (non HMO) | Admitting: Obstetrics & Gynecology

## 2023-04-22 ENCOUNTER — Encounter: Payer: Self-pay | Admitting: Obstetrics & Gynecology

## 2023-04-22 ENCOUNTER — Ambulatory Visit (INDEPENDENT_AMBULATORY_CARE_PROVIDER_SITE_OTHER): Payer: Managed Care, Other (non HMO)

## 2023-04-22 VITALS — BP 111/73 | HR 73 | Wt 202.6 lb

## 2023-04-22 DIAGNOSIS — O88213 Thromboembolism in pregnancy, third trimester: Secondary | ICD-10-CM | POA: Diagnosis not present

## 2023-04-22 DIAGNOSIS — O0993 Supervision of high risk pregnancy, unspecified, third trimester: Secondary | ICD-10-CM | POA: Insufficient documentation

## 2023-04-22 DIAGNOSIS — O24410 Gestational diabetes mellitus in pregnancy, diet controlled: Secondary | ICD-10-CM

## 2023-04-22 DIAGNOSIS — Z3A36 36 weeks gestation of pregnancy: Secondary | ICD-10-CM

## 2023-04-22 DIAGNOSIS — O88219 Thromboembolism in pregnancy, unspecified trimester: Secondary | ICD-10-CM

## 2023-04-22 DIAGNOSIS — Z86711 Personal history of pulmonary embolism: Secondary | ICD-10-CM

## 2023-04-22 NOTE — Progress Notes (Signed)
[place] 36+4 wks,cephalic,FHR 146 bpm,posterior placenta gr 3,AFI 10 cm,EFW 3417 g 89%

## 2023-04-22 NOTE — Progress Notes (Addendum)
HIGH-RISK PREGNANCY VISIT Patient name: [name] MRN [id number]  Date of birth: 1986/08/23 Chief Complaint:   Routine Prenatal Visit  History of Present Illness:   [name] is a 37 y.o. H4V4259 female at [redacted]w[redacted]d with an Estimated Date of Delivery: 05/16/23 being seen today for ongoing management of a high-risk pregnancy complicated by:  GDMA1 Diet controlled, did not bring log, per pt well controlled  H/o PE on lovenox daily  Today she reports  intermittent back pain on right side.  Pt previously given a medication, but notes decreased movement when she takes this and so she is no longer taking this med .   Contractions: Irritability. Vag. Bleeding: None.  Movement: Present. no leaking of fluid.      11/26/2022   11:36 AM 02/21/2020   11:52 AM  Depression screen PHQ 2/9  Decreased Interest 0 0  Down, Depressed, Hopeless 0 0  PHQ - 2 Score 0 0  Altered sleeping 0 0  Tired, decreased energy 0 2  Change in appetite 0 2  Feeling bad or failure about yourself  0 0  Trouble concentrating 0 0  Moving slowly or fidgety/restless 0 0  Suicidal thoughts 0 0  PHQ-9 Score 0 4  Difficult doing work/chores  Not difficult at all     Current Outpatient Medications  Medication Instructions   Accu-Chek Softclix Lancets lancets Use as instructed to check blood sugar 4 times daily   Blood Glucose Monitoring Suppl (ACCU-CHEK GUIDE ME) w/Device KIT 1 each, Does not apply, 4 times daily   cyclobenzaprine (FLEXERIL) 10 mg, Oral, Every 8 hours PRN   enoxaparin (LOVENOX) 40 mg, Subcutaneous, Every 24 hours   glucose blood (ACCU-CHEK GUIDE) test strip Use as instructed to check blood sugar 4 times daily   omeprazole (PRILOSEC OTC) 20 mg, Oral, Daily   Prenatal Vit-Fe Fumarate-FA (PRENATAL VITAMIN PO) Oral     Review of Systems:   Pertinent items are noted in HPI Denies abnormal vaginal discharge w/ itching/odor/irritation, headaches, visual changes, shortness of breath, chest pain,  abdominal pain, severe nausea/vomiting, or problems with urination or bowel movements unless otherwise stated above. Pertinent History Reviewed:  Reviewed past medical,surgical, social, obstetrical and family history.  Reviewed problem list, medications and allergies. Physical Assessment:   Vitals:   04/22/23 1547  BP: 111/73  Pulse: 73  Weight: 202 lb 9.6 oz (91.9 kg)  Body mass index is 35.89 kg/m.           Physical Examination:   General appearance: alert, well appearing, and in no distress  Mental status: normal mood, behavior, speech, dress, motor activity, and thought processes  Skin: warm & dry   Extremities:      Cardiovascular: normal heart rate noted  Respiratory: normal respiratory effort, no distress  Abdomen: gravid, soft, non-tender  Pelvic: Cervical exam deferred  Vaginal swabs completed       Fetal Status:     Movement: Present    Fetal Surveillance Testing today: cephalic,FHR 146 bpm,posterior placenta gr 3,AFI 10 cm,EFW 3417 g 89%    Chaperone: Faith Rogue    No results found for this or any previous visit (from the past 24 hour(s)).   Assessment & Plan:  High-risk pregnancy: [id number] at 101w4d with an Estimated Date of Delivery: 05/16/23   1) GDMA1 Diet controlled EFW 89% Discussed IOL 39-40wks, pt declined IOL  2) h/o PE, on Lovenox Discussed scheduled IOL due to discontinuation of Lovenox 24hr prior to delivery due  to risk of bleeding and epidural -pt reports that she does not want an epidural and goal would be to avoid IOL  3) Contraceptive management -reviewed options, prefers non-hormonal -likely Paragard IUD  -letter written to work from home  Meds: No orders of the defined types were placed in this encounter.   Labs/procedures today: growth scan  Treatment Plan:  routine OB care  Reviewed: Preterm labor symptoms and general obstetric precautions including but not limited to vaginal bleeding, contractions, leaking of fluid and fetal  movement were reviewed in detail with the patient.  All questions were answered.   Follow-up: Return in about 1 week (around 04/29/2023) for HROB visit.   Future Appointments  Date Time Provider Department Center  04/29/2023  9:10 AM Jacklyn Shell, CNM [facility]  05/07/2023  9:10 AM Myna Hidalgo, DO [facility]  05/13/2023  9:30 AM Marzella Schlein, Scarlette Calico, CNM [facility]  07/02/2023  8:00 AM Tobb, Lavona Mound, DO [facility] None    Orders Placed This Encounter  Procedures   Culture, beta strep (group b only)    Myna Hidalgo, DO Attending Obstetrician & Gynecologist, [facility] [employer], [facility]

## 2023-04-29 ENCOUNTER — Encounter: Payer: Self-pay | Admitting: Advanced Practice Midwife

## 2023-04-29 ENCOUNTER — Ambulatory Visit: Payer: Managed Care, Other (non HMO) | Admitting: Advanced Practice Midwife

## 2023-04-29 VITALS — BP 105/66 | HR 70 | Wt 203.4 lb

## 2023-04-29 DIAGNOSIS — Z86711 Personal history of pulmonary embolism: Secondary | ICD-10-CM

## 2023-04-29 DIAGNOSIS — O24410 Gestational diabetes mellitus in pregnancy, diet controlled: Secondary | ICD-10-CM

## 2023-04-29 DIAGNOSIS — O0993 Supervision of high risk pregnancy, unspecified, third trimester: Secondary | ICD-10-CM

## 2023-04-29 DIAGNOSIS — Z3A37 37 weeks gestation of pregnancy: Secondary | ICD-10-CM

## 2023-04-29 NOTE — Progress Notes (Signed)
HIGH-RISK PREGNANCY VISIT Patient name: [name] MRN [id number]  Date of birth: 20-Nov-1985 Chief Complaint:   Routine Prenatal Visit (Cervical check)  History of Present Illness:   [name] is a 37 y.o. [id number] female at [redacted]w[redacted]d with an Estimated Date of Delivery: 05/16/23 being seen today for ongoing management of a high-risk pregnancy complicated by diabetes mellitus A1DM.   FBS <95 and 2 hr pp <120 except for a few elevated after eating carbs.  Some watery discharge Today she reports no complaints. Contractions: Regular.  .  Movement: Present. denies leaking of fluid.      11/26/2022   11:36 AM 02/21/2020   11:52 AM  Depression screen PHQ 2/9  Decreased Interest 0 0  Down, Depressed, Hopeless 0 0  PHQ - 2 Score 0 0  Altered sleeping 0 0  Tired, decreased energy 0 2  Change in appetite 0 2  Feeling bad or failure about yourself  0 0  Trouble concentrating 0 0  Moving slowly or fidgety/restless 0 0  Suicidal thoughts 0 0  PHQ-9 Score 0 4  Difficult doing work/chores  Not difficult at all        11/26/2022   11:36 AM 02/21/2020   11:52 AM  GAD 7 : Generalized Anxiety Score  Nervous, Anxious, on Edge 0 0  Control/stop worrying 0 0  Worry too much - different things 1 2  Trouble relaxing 0 2  Restless 0 2  Easily annoyed or irritable 0 0  Afraid - awful might happen 0 0  Total GAD 7 Score 1 6  Anxiety Difficulty  Not difficult at all     Review of Systems:   Pertinent items are noted in HPI Denies abnormal vaginal discharge w/ itching/odor/irritation, headaches, visual changes, shortness of breath, chest pain, abdominal pain, severe nausea/vomiting, or problems with urination or bowel movements unless otherwise stated above. Pertinent History Reviewed:  Reviewed past medical,surgical, social, obstetrical and family history.  Reviewed problem list, medications and allergies. Physical Assessment:   Vitals:   04/29/23 0925  BP: 105/66  Pulse: 70  Weight: 203  lb 6.4 oz (92.3 kg)  Body mass index is 36.03 kg/m.           Physical Examination:   General appearance: alert, well appearing, and in no distress  Mental status: alert, oriented to person, place, and time  Skin: warm & dry   Extremities: Edema: Trace    Cardiovascular: normal heart rate noted  Respiratory: normal respiratory effort, no distress  Abdomen: gravid, soft, non-tender  Pelvic: Cervical exam performed  Dilation: 2 Effacement (%): Thick Station: -1  Fetal Status: Fetal Heart Rate (bpm): 140 Fundal Height: 37 cm Movement: Present Presentation: Vertex  Fetal Surveillance Testing today: doppler   Chaperone: Peggy Dones    No results found for this or any previous visit (from the past 24 hour(s)).  Assessment & Plan:  High-risk pregnancy: [id number] at [redacted]w[redacted]d with an Estimated Date of Delivery: 05/16/23   1. High-risk pregnancy in third trimester  2. [redacted] weeks gestation of pregnancy   3. History of pulmonary embolus (PE) Doesn't plan epidural,   4. Diet controlled gestational diabetes mellitus (GDM) in third trimester Good control; declines IOL, ok with membrane stripping.    Meds: No orders of the defined types were placed in this encounter.   Orders: No orders of the defined types were placed in this encounter.    Labs/procedures today: none   Reviewed: Term labor  symptoms and general obstetric precautions including but not limited to vaginal bleeding, contractions, leaking of fluid and fetal movement were reviewed in detail with the patient.  All questions were answered. Does have home bp cuff. Office bp cuff given: not applicable. Check bp weekly, let us know if consistently >140 and/or >90.  Follow-up: No follow-ups on file.   Future Appointments  Date Time Provider Department Center  05/07/2023  9:10 AM Myna Hidalgo, DO [facility]  05/13/2023  9:30 AM Cresenzo-Dishmon, Scarlette Calico, CNM [facility]  07/02/2023  8:00 AM Tobb, Lavona Mound, DO [facility] None     No orders of the defined types were placed in this encounter.  Jacklyn Shell , DNP, CNM [facility] 04/29/2023 10:02 AM

## 2023-05-07 ENCOUNTER — Ambulatory Visit (INDEPENDENT_AMBULATORY_CARE_PROVIDER_SITE_OTHER): Payer: Managed Care, Other (non HMO) | Admitting: Obstetrics & Gynecology

## 2023-05-07 ENCOUNTER — Encounter: Payer: Self-pay | Admitting: Obstetrics & Gynecology

## 2023-05-07 VITALS — BP 109/70 | HR 65 | Wt 203.8 lb

## 2023-05-07 DIAGNOSIS — O0993 Supervision of high risk pregnancy, unspecified, third trimester: Secondary | ICD-10-CM

## 2023-05-07 DIAGNOSIS — O24410 Gestational diabetes mellitus in pregnancy, diet controlled: Secondary | ICD-10-CM

## 2023-05-07 DIAGNOSIS — Z3A38 38 weeks gestation of pregnancy: Secondary | ICD-10-CM

## 2023-05-07 DIAGNOSIS — Z86711 Personal history of pulmonary embolism: Secondary | ICD-10-CM

## 2023-05-07 NOTE — Progress Notes (Signed)
HIGH-RISK PREGNANCY VISIT Patient name: [name] MRN [id number]  Date of birth: 06/17/1986 Chief Complaint:   Routine Prenatal Visit  History of Present Illness:   [name] is a 37 y.o. [id number] female at [redacted]w[redacted]d with an Estimated Date of Delivery: 05/16/23 being seen today for ongoing management of a high-risk pregnancy complicated by:  -GDMA1 Well controlled -h/o PE on Lovenox  Today she reports no complaints.   Contractions: Regular. Vag. Bleeding: None.  Movement: Present. denies leaking of fluid.      11/26/2022   11:36 AM 02/21/2020   11:52 AM  Depression screen PHQ 2/9  Decreased Interest 0 0  Down, Depressed, Hopeless 0 0  PHQ - 2 Score 0 0  Altered sleeping 0 0  Tired, decreased energy 0 2  Change in appetite 0 2  Feeling bad or failure about yourself  0 0  Trouble concentrating 0 0  Moving slowly or fidgety/restless 0 0  Suicidal thoughts 0 0  PHQ-9 Score 0 4  Difficult doing work/chores  Not difficult at all     Current Outpatient Medications  Medication Instructions   Accu-Chek Softclix Lancets lancets Use as instructed to check blood sugar 4 times daily   Blood Glucose Monitoring Suppl (ACCU-CHEK GUIDE ME) w/Device KIT 1 each, Does not apply, 4 times daily   enoxaparin (LOVENOX) 40 mg, Subcutaneous, Every 24 hours   glucose blood (ACCU-CHEK GUIDE) test strip Use as instructed to check blood sugar 4 times daily   omeprazole (PRILOSEC OTC) 20 mg, Oral, Daily   Prenatal Vit-Fe Fumarate-FA (PRENATAL VITAMIN PO) Oral     Review of Systems:   Pertinent items are noted in HPI Denies abnormal vaginal discharge w/ itching/odor/irritation, headaches, visual changes, shortness of breath, chest pain, abdominal pain, severe nausea/vomiting, or problems with urination or bowel movements unless otherwise stated above. Pertinent History Reviewed:  Reviewed past medical,surgical, social, obstetrical and family history.  Reviewed problem list, medications and  allergies. Physical Assessment:   Vitals:   05/07/23 0901  BP: 109/70  Pulse: 65  Weight: 203 lb 12.8 oz (92.4 kg)  Body mass index is 36.1 kg/m.           Physical Examination:   General appearance: alert, well appearing, and in no distress  Mental status: normal mood, behavior, speech, dress, motor activity, and thought processes  Skin: warm & dry   Extremities: Edema: Mild pitting, slight indentation    Cardiovascular: normal heart rate noted  Respiratory: normal respiratory effort, no distress  Abdomen: gravid, soft, non-tender  Pelvic: Cervical exam performed  Dilation: 2.5 Effacement (%): 50 Station: -2  Fetal Status: Fetal Heart Rate (bpm): 135 Fundal Height: 37 cm Movement: Present    Fetal Surveillance Testing today: doppler   Chaperone:  pt declined     No results found for this or any previous visit (from the past 24 hour(s)).   Assessment & Plan:  High-risk pregnancy: [id number] at [redacted]w[redacted]d with an Estimated Date of Delivery: 05/16/23   1) GDMA1 Diet controlled Would prefer to avoid induction, however would reconsider after 40 weeks  2) history of PE, currently on Lovenox -Patient does not want epidural  Meds: No orders of the defined types were placed in this encounter.   Labs/procedures today: doppler  Treatment Plan:  routine OB care, []  consideration for IOL at next visit  Reviewed: Term labor symptoms and general obstetric precautions including but not limited to vaginal bleeding, contractions, leaking of fluid and fetal movement  were reviewed in detail with the patient.  All questions were answered. Pt has home bp cuff. Check bp weekly, let us know if >140/90.   Follow-up: Return in about 1 week (around 05/14/2023) for HROB weekly (ok for CNM).   Future Appointments  Date Time Provider Department Center  05/13/2023  9:30 AM Jacklyn Shell, CNM [facility]  07/02/2023  8:00 AM Tobb, Lavona Mound, DO [facility] None    No orders of the defined  types were placed in this encounter.   Myna Hidalgo, DO Attending Obstetrician & Gynecologist, [facility] for [employer], [facility]

## 2023-05-08 ENCOUNTER — Encounter (HOSPITAL_COMMUNITY): Payer: Self-pay | Admitting: Obstetrics and Gynecology

## 2023-05-08 ENCOUNTER — Inpatient Hospital Stay (HOSPITAL_COMMUNITY)
Admission: AD | Admit: 2023-05-08 | Discharge: 2023-05-10 | DRG: 806 | Disposition: A | Discharge status: Home / Self Care | Payer: Managed Care, Other (non HMO) | Attending: Obstetrics and Gynecology | Admitting: Obstetrics and Gynecology

## 2023-05-08 DIAGNOSIS — O09529 Supervision of elderly multigravida, unspecified trimester: Secondary | ICD-10-CM

## 2023-05-08 DIAGNOSIS — Z5986 Financial insecurity: Secondary | ICD-10-CM

## 2023-05-08 DIAGNOSIS — O0990 Supervision of high risk pregnancy, unspecified, unspecified trimester: Secondary | ICD-10-CM

## 2023-05-08 DIAGNOSIS — Z86711 Personal history of pulmonary embolism: Secondary | ICD-10-CM | POA: Diagnosis present

## 2023-05-08 DIAGNOSIS — Z1379 Encounter for other screening for genetic and chromosomal anomalies: Secondary | ICD-10-CM

## 2023-05-08 DIAGNOSIS — O9912 Other diseases of the blood and blood-forming organs and certain disorders involving the immune mechanism complicating childbirth: Secondary | ICD-10-CM | POA: Diagnosis present

## 2023-05-08 DIAGNOSIS — O24420 Gestational diabetes mellitus in childbirth, diet controlled: Principal | ICD-10-CM | POA: Diagnosis present

## 2023-05-08 DIAGNOSIS — O99824 Streptococcus B carrier state complicating childbirth: Secondary | ICD-10-CM | POA: Diagnosis present

## 2023-05-08 DIAGNOSIS — Z3A39 39 weeks gestation of pregnancy: Principal | ICD-10-CM

## 2023-05-08 DIAGNOSIS — O24419 Gestational diabetes mellitus in pregnancy, unspecified control: Secondary | ICD-10-CM | POA: Diagnosis present

## 2023-05-08 DIAGNOSIS — D689 Coagulation defect, unspecified: Secondary | ICD-10-CM | POA: Diagnosis present

## 2023-05-08 DIAGNOSIS — Z7901 Long term (current) use of anticoagulants: Secondary | ICD-10-CM

## 2023-05-08 HISTORY — DX: Gestational diabetes mellitus in pregnancy, unspecified control: O24.419

## 2023-05-08 NOTE — MAU Note (Addendum)
.  [name] is a 37 y.o. at [redacted]w[redacted]d here in MAU reporting: contractions that began at 1900 Deneis SROM, vaginal bleeding or bloody show. Endorses + fetal movement 3cm yesterday in office A1GDM Hx PE on Lovenox  GBS + Care at [facility]  Onset of complaint: 1900 Pain score: 10 Vitals:   05/09/23 0001  BP: 125/76  Pulse: 94  Resp: 17  Temp: 98.2 F (36.8 C)  SpO2: 99%     FHT:154bpm Lab orders placed from triage:  mau labor  SVE 5/100/-1

## 2023-05-09 ENCOUNTER — Encounter (HOSPITAL_COMMUNITY): Payer: Self-pay | Admitting: Obstetrics and Gynecology

## 2023-05-09 ENCOUNTER — Other Ambulatory Visit: Payer: Self-pay

## 2023-05-09 DIAGNOSIS — D689 Coagulation defect, unspecified: Secondary | ICD-10-CM | POA: Diagnosis present

## 2023-05-09 DIAGNOSIS — Z5986 Financial insecurity: Secondary | ICD-10-CM | POA: Diagnosis not present

## 2023-05-09 DIAGNOSIS — Z3A39 39 weeks gestation of pregnancy: Principal | ICD-10-CM

## 2023-05-09 DIAGNOSIS — O99824 Streptococcus B carrier state complicating childbirth: Secondary | ICD-10-CM | POA: Diagnosis present

## 2023-05-09 DIAGNOSIS — Z7901 Long term (current) use of anticoagulants: Secondary | ICD-10-CM | POA: Diagnosis not present

## 2023-05-09 DIAGNOSIS — O09523 Supervision of elderly multigravida, third trimester: Secondary | ICD-10-CM | POA: Diagnosis not present

## 2023-05-09 DIAGNOSIS — O99820 Streptococcus B carrier state complicating pregnancy: Secondary | ICD-10-CM | POA: Diagnosis not present

## 2023-05-09 DIAGNOSIS — O24420 Gestational diabetes mellitus in childbirth, diet controlled: Secondary | ICD-10-CM | POA: Diagnosis present

## 2023-05-09 DIAGNOSIS — O26893 Other specified pregnancy related conditions, third trimester: Secondary | ICD-10-CM | POA: Diagnosis present

## 2023-05-09 DIAGNOSIS — O9912 Other diseases of the blood and blood-forming organs and certain disorders involving the immune mechanism complicating childbirth: Secondary | ICD-10-CM | POA: Diagnosis present

## 2023-05-09 DIAGNOSIS — Z86711 Personal history of pulmonary embolism: Secondary | ICD-10-CM | POA: Diagnosis not present

## 2023-05-09 LAB — CBC
HCT: 32.5 % — ABNORMAL LOW (ref 36.0–46.0)
Hemoglobin: 10.4 g/dL — ABNORMAL LOW (ref 12.0–15.0)
MCH: 27.2 pg (ref 26.0–34.0)
MCHC: 32 g/dL (ref 30.0–36.0)
MCV: 84.9 fL (ref 80.0–100.0)
Platelets: 234 10*3/uL (ref 150–400)
RBC: 3.83 MIL/uL — ABNORMAL LOW (ref 3.87–5.11)
RDW: 13.7 % (ref 11.5–15.5)
WBC: 9.7 10*3/uL (ref 4.0–10.5)
nRBC: 0 % (ref 0.0–0.2)

## 2023-05-09 LAB — TYPE AND SCREEN
ABO/RH(D): O POS
Antibody Screen: NEGATIVE

## 2023-05-09 LAB — BASIC METABOLIC PANEL
Anion gap: 11 (ref 5–15)
BUN: 5 mg/dL — ABNORMAL LOW (ref 6–20)
CO2: 19 mmol/L — ABNORMAL LOW (ref 22–32)
Calcium: 8.6 mg/dL — ABNORMAL LOW (ref 8.9–10.3)
Chloride: 103 mmol/L (ref 98–111)
Creatinine, Ser: 0.73 mg/dL (ref 0.44–1.00)
GFR, Estimated: >60 mL/min (ref >60)
Glucose, Bld: 153 mg/dL — ABNORMAL HIGH (ref 70–99)
Potassium: 3.3 mmol/L — ABNORMAL LOW (ref 3.5–5.1)
Sodium: 133 mmol/L — ABNORMAL LOW (ref 135–145)

## 2023-05-09 LAB — RPR: RPR Ser Ql: NONREACTIVE

## 2023-05-09 MED ORDER — PRENATAL MULTIVITAMIN CH
1.0000 | ORAL_TABLET | Freq: Every day | ORAL | Status: DC
  Administered 2023-05-09 – 2023-05-10 (×2): 1 via ORAL
  Filled 2023-05-09 (×2): qty 1

## 2023-05-09 MED ORDER — DEXTROSE 50 % IV SOLN: 0.0000 mL | INTRAVENOUS | Status: DC | PRN

## 2023-05-09 MED ORDER — LACTATED RINGERS IV SOLN: INTRAVENOUS | Status: DC

## 2023-05-09 MED ORDER — ONDANSETRON HCL 4 MG/2ML IJ SOLN: 4.0000 mg | Freq: Four times a day (QID) | INTRAMUSCULAR | Status: DC | PRN

## 2023-05-09 MED ORDER — FLEET ENEMA 7-19 GM/118ML RE ENEM: 1.0000 | ENEMA | RECTAL | Status: DC | PRN

## 2023-05-09 MED ORDER — DIBUCAINE (PERIANAL) 1 % EX OINT: 1.0000 | TOPICAL_OINTMENT | CUTANEOUS | Status: DC | PRN

## 2023-05-09 MED ORDER — DEXTROSE IN LACTATED RINGERS 5 % IV SOLN: INTRAVENOUS | Status: DC

## 2023-05-09 MED ORDER — SOD CITRATE-CITRIC ACID 500-334 MG/5ML PO SOLN
30.0000 mL | ORAL | Status: DC | PRN
  Administered 2023-05-09: 30 mL via ORAL
  Filled 2023-05-09: qty 30

## 2023-05-09 MED ORDER — BENZOCAINE-MENTHOL 20-0.5 % EX AERO: 1.0000 | INHALATION_SPRAY | CUTANEOUS | Status: DC | PRN

## 2023-05-09 MED ORDER — ONDANSETRON HCL 4 MG/2ML IJ SOLN: 4.0000 mg | INTRAMUSCULAR | Status: DC | PRN

## 2023-05-09 MED ORDER — ACETAMINOPHEN 325 MG PO TABS
650.0000 mg | ORAL_TABLET | ORAL | Status: DC | PRN
  Administered 2023-05-10: 650 mg via ORAL
  Filled 2023-05-09: qty 2

## 2023-05-09 MED ORDER — ZOLPIDEM TARTRATE 5 MG PO TABS: 5.0000 mg | ORAL_TABLET | Freq: Every evening | ORAL | Status: DC | PRN

## 2023-05-09 MED ORDER — SENNOSIDES-DOCUSATE SODIUM 8.6-50 MG PO TABS
2.0000 | ORAL_TABLET | ORAL | Status: DC
  Administered 2023-05-09 – 2023-05-10 (×2): 2 via ORAL
  Filled 2023-05-09 (×2): qty 2

## 2023-05-09 MED ORDER — LIDOCAINE HCL (PF) 1 % IJ SOLN
30.0000 mL | INTRAMUSCULAR | Status: DC | PRN
  Administered 2023-05-09: 30 mL via SUBCUTANEOUS
  Filled 2023-05-09: qty 30

## 2023-05-09 MED ORDER — DIPHENHYDRAMINE HCL 25 MG PO CAPS: 25.0000 mg | ORAL_CAPSULE | Freq: Four times a day (QID) | ORAL | Status: DC | PRN

## 2023-05-09 MED ORDER — COCONUT OIL OIL
1.0000 | TOPICAL_OIL | Status: DC | PRN
  Administered 2023-05-10: 1 via TOPICAL

## 2023-05-09 MED ORDER — OXYTOCIN-SODIUM CHLORIDE 30-0.9 UT/500ML-% IV SOLN
2.5000 [IU]/h | INTRAVENOUS | Status: DC
  Filled 2023-05-09: qty 500

## 2023-05-09 MED ORDER — CALCIUM CARBONATE ANTACID 500 MG PO CHEW: 400.0000 mg | CHEWABLE_TABLET | Freq: Four times a day (QID) | ORAL | Status: DC | PRN

## 2023-05-09 MED ORDER — SIMETHICONE 80 MG PO CHEW: 80.0000 mg | CHEWABLE_TABLET | ORAL | Status: DC | PRN

## 2023-05-09 MED ORDER — OXYCODONE-ACETAMINOPHEN 5-325 MG PO TABS: 1.0000 | ORAL_TABLET | ORAL | Status: DC | PRN

## 2023-05-09 MED ORDER — IBUPROFEN 600 MG PO TABS
600.0000 mg | ORAL_TABLET | Freq: Four times a day (QID) | ORAL | Status: DC
  Administered 2023-05-09 – 2023-05-10 (×5): 600 mg via ORAL
  Filled 2023-05-09 (×5): qty 1

## 2023-05-09 MED ORDER — PENICILLIN G POT IN DEXTROSE 60000 UNIT/ML IV SOLN
3.0000 10*6.[IU] | INTRAVENOUS | Status: DC
  Administered 2023-05-09: 3 10*6.[IU] via INTRAVENOUS
  Filled 2023-05-09: qty 50

## 2023-05-09 MED ORDER — LACTATED RINGERS IV SOLN: 500.0000 mL | INTRAVENOUS | Status: DC | PRN

## 2023-05-09 MED ORDER — INSULIN REGULAR(HUMAN) IN NACL 100-0.9 UT/100ML-% IV SOLN: INTRAVENOUS | Status: DC

## 2023-05-09 MED ORDER — OXYCODONE-ACETAMINOPHEN 5-325 MG PO TABS: 2.0000 | ORAL_TABLET | ORAL | Status: DC | PRN

## 2023-05-09 MED ORDER — ENOXAPARIN SODIUM 40 MG/0.4ML IJ SOSY
40.0000 mg | PREFILLED_SYRINGE | INTRAMUSCULAR | Status: DC
  Administered 2023-05-09: 40 mg via SUBCUTANEOUS
  Filled 2023-05-09: qty 0.4

## 2023-05-09 MED ORDER — ACETAMINOPHEN 325 MG PO TABS: 650.0000 mg | ORAL_TABLET | ORAL | Status: DC | PRN

## 2023-05-09 MED ORDER — TETANUS-DIPHTH-ACELL PERTUSSIS 5-2.5-18.5 LF-MCG/0.5 IM SUSY: 0.5000 mL | PREFILLED_SYRINGE | Freq: Once | INTRAMUSCULAR | Status: DC

## 2023-05-09 MED ORDER — FENTANYL CITRATE (PF) 100 MCG/2ML IJ SOLN
50.0000 ug | INTRAMUSCULAR | Status: DC | PRN
  Administered 2023-05-09: 100 ug via INTRAVENOUS
  Administered 2023-05-09: 50 ug via INTRAVENOUS
  Filled 2023-05-09 (×2): qty 2

## 2023-05-09 MED ORDER — OXYTOCIN BOLUS FROM INFUSION
333.0000 mL | Freq: Once | INTRAVENOUS | Status: AC
  Administered 2023-05-09: 333 mL via INTRAVENOUS

## 2023-05-09 MED ORDER — WITCH HAZEL-GLYCERIN EX PADS: 1.0000 | MEDICATED_PAD | CUTANEOUS | Status: DC | PRN

## 2023-05-09 MED ORDER — ONDANSETRON HCL 4 MG PO TABS: 4.0000 mg | ORAL_TABLET | ORAL | Status: DC | PRN

## 2023-05-09 MED ORDER — INSULIN ASPART 100 UNIT/ML IJ SOLN
0.0000 [IU] | INTRAMUSCULAR | Status: DC
  Administered 2023-05-09: 3 [IU] via SUBCUTANEOUS

## 2023-05-09 MED ORDER — SODIUM CHLORIDE 0.9 % IV SOLN
5.0000 10*6.[IU] | Freq: Once | INTRAVENOUS | Status: AC
  Administered 2023-05-09: 5 10*6.[IU] via INTRAVENOUS
  Filled 2023-05-09: qty 5

## 2023-05-09 NOTE — H&P (Addendum)
OBSTETRIC ADMISSION HISTORY AND PHYSICAL  [name] is a 37 y.o. female [phone] with IUP at [redacted]w[redacted]d by LMP presenting for Active labor. She reports +FMs, No LOF, no VB, no blurry vision, headaches or peripheral edema, and RUQ pain.  She plans on breast and bottle feeding. She request Postpartum Paraguard for birth control. She received her prenatal care at [facility]   Dating: By LMP --->  Estimated Date of Delivery: 05/16/23  Sono:    @[redacted]w[redacted]d , CWD, normal anatomy, cephalic presentation, posterior placental lie, 3417g, 89% EFW   Prenatal History/Complications: GDMA1, history of PE (due to OCP, on Lovenox)  Nursing Staff Provider  Office Location [facility] Dating  05/23/2023, by Last Menstrual Period  [facility] Model [x]  Traditional [ ]  Centering [ ]  Mom-Baby Dyad    Language  English Anatomy US  normal  Flu Vaccine  N/a Genetic/Carrier Screen  declined  TDaP Vaccine   7/12 Hgb A1C or  GTT Early 5.3 Third trimester  Fail  COVID Vaccine    LAB RESULTS   Rhogam  O/Positive/-- (03/01 1029)  Blood Type O/Positive/-- (03/01 1029) O positive  Baby Feeding Plan both Antibody Negative (05/23 0841)negative  Contraception Paragard postpartum Rubella 1.01 (03/01 1029)immune  Circumcision no RPR Non Reactive (05/23 0841) Non reactive  Pediatrician  [facility] HBsAg Negative (03/01 1029) negative  Support Person  HCVAb Non Reactive (03/01 1029) negative  Prenatal Classes  HIV Non Reactive (05/23 0841)   negtaive  BTL Consent  GBS Positive/-- (07/25 1400)positive (For PCN allergy, check sensitivities)   VBAC Consent  Pap Diagnosis  Date Value Ref Range Status  12/24/2022   Final   - Negative for intraepithelial lesion or malignancy (NILM)         DME Rx [x]  BP cuff [ ]  Weight Scale Waterbirth  [ ]  Class [ ]  Consent [ ]  CNM visit  PHQ9 & GAD7 [ x] new OB [  ] 28 weeks  [  ] 36 weeks Induction  [ ]  Orders Entered [ ] Foley Y/N    Past Medical History: Past Medical History:  Diagnosis Date    Family history of BRCA1 gene positive    Family history of breast cancer    Gestational diabetes    History of pulmonary embolus (PE)     Past Surgical History: Past Surgical History:  Procedure Laterality Date   NO PAST SURGERIES     REMOVAL OF NON VAGINAL CONTRACEPTIVE DEVICE Right 06/07/2014   Procedure: REMOVAL OF DEEP IMPLANON IMPLANT, ULTRASOUND GUIDED, RIGHT UPPER ARM;  Surgeon: Tilda Burrow, MD;  Location: [facility];  Service: Gynecology;  Laterality: Right;    Obstetrical History: OB History     Gravida  5   Para  2   Term  2   Preterm      AB  2   Living  2      SAB  2   IAB      Ectopic      Multiple      Live Births  2           Social History Social History   Socioeconomic History   Marital status: Married    Spouse name: Not on file   Number of children: Not on file   Years of education: Not on file   Highest education level: Not on file  Occupational History   Not on file  Tobacco Use   Smoking status: Never   Smokeless tobacco: Never  Vaping Use   Vaping status: Never Used  Substance and Sexual Activity   Alcohol use: No   Drug use: No   Sexual activity: Yes    Birth control/protection: None  Other Topics Concern   Not on file  Social History Narrative   Not on file   Social Determinants of Health   Financial Resource Strain: Medium Risk (11/26/2022)   Overall Financial Resource Strain (CARDIA)    Difficulty of Paying Living Expenses: Somewhat hard  Food Insecurity: No Food Insecurity (05/09/2023)   Hunger Vital Sign    Worried About Running Out of Food in the Last Year: Never true    Ran Out of Food in the Last Year: Never true  Transportation Needs: No Transportation Needs (05/09/2023)   PRAPARE - Administrator, Civil Service (Medical): No    Lack of Transportation (Non-Medical): No  Physical Activity: Sufficiently Active (11/26/2022)   Exercise Vital Sign    Days of Exercise per Week: 3 days     Minutes of Exercise per Session: 60 min  Stress: Stress Concern Present (11/26/2022)   [employer] of Occupational Health - Occupational Stress Questionnaire    Feeling of Stress : To some extent  Social Connections: Moderately Integrated (11/26/2022)   Social Connection and Isolation Panel [NHANES]    Frequency of Communication with Friends and Family: Twice a week    Frequency of Social Gatherings with Friends and Family: Three times a week    Attends Religious Services: 1 to 4 times per year    Active Member of Clubs or Organizations: No    Attends Banker Meetings: Never    Marital Status: Married    Family History: Family History  Problem Relation Age of Onset   Diabetes Mother    Hypertension Mother    Breast cancer Mother 69   Breast cancer Sister    Cancer Sister        breast   Breast cancer Maternal Aunt 60   Stomach cancer Maternal Aunt    Stomach cancer Maternal Grandfather     Allergies: No Known Allergies  Medications Prior to Admission  Medication Sig Dispense Refill Last Dose   enoxaparin (LOVENOX) 40 MG/0.4ML injection Inject 0.4 mLs (40 mg total) into the skin daily. 30 mL 11 05/07/2023   omeprazole (PRILOSEC OTC) 20 MG tablet Take 1 tablet (20 mg total) by mouth daily. 30 tablet 6 05/07/2023   Prenatal Vit-Fe Fumarate-FA (PRENATAL VITAMIN PO) Take by mouth.   05/08/2023   Accu-Chek Softclix Lancets lancets Use as instructed to check blood sugar 4 times daily 100 each 12    Blood Glucose Monitoring Suppl (ACCU-CHEK GUIDE ME) w/Device KIT 1 each by Does not apply route 4 (four) times daily. 1 kit 0    glucose blood (ACCU-CHEK GUIDE) test strip Use as instructed to check blood sugar 4 times daily 50 each 12      Review of Systems   All systems reviewed and negative except as stated in HPI  Blood pressure 125/76, pulse 94, temperature 98.2 F (36.8 C), temperature source Oral, resp. rate 17, height 5\' 3"  (1.6 m), weight 92.4 kg, last menstrual  period 08/09/2022, SpO2 99%. General appearance: alert, cooperative, and appears stated age Lungs: clear to auscultation bilaterally Heart: regular rate and rhythm Abdomen: soft, non-tender; bowel sounds normal Pelvic: CVE as below Extremities: Homans sign is negative, no sign of DVT Presentation: cephalic Fetal monitoringBaseline: 150 bpm, Variability: Good {> 6 bpm), Accelerations:  Non-reactive but appropriate for gestational age, and Decelerations: Absent Uterine activityFrequency: Every 2-3 minutes Dilation: 5 Effacement (%): 100 Station: -1 Exam by:: Ginnie Smart RN   Prenatal labs: ABO, Rh: --/--/O POS (08/11 0014) Antibody: NEG (08/11 0014) Rubella: 1.01 (03/01 1029) RPR: Non Reactive (05/23 0841)  HBsAg: Negative (03/01 1029)  HIV: Non Reactive (05/23 0841)  GBS: Positive/-- (07/25 1400)  1 hr Glucola Failed Genetic screening  Declined Anatomy US Normal  Prenatal Transfer Tool  Maternal Diabetes: Yes:  Diabetes Type:  Diet controlled Genetic Screening: Declined Maternal Ultrasounds/Referrals: Normal Fetal Ultrasounds or other Referrals:  None Maternal Substance Abuse:  No Significant Maternal Medications:  Meds include: Other: Lovenox Significant Maternal Lab Results:  Group B Strep positive Number of Prenatal Visits:greater than 3 verified prenatal visits Other Comments:  None  Results for orders placed or performed during the hospital encounter of 05/08/23 (from the past 24 hour(s))  CBC   Collection Time: 05/09/23 12:14 AM  Result Value Ref Range   WBC 9.7 4.0 - 10.5 K/uL   RBC 3.83 (L) 3.87 - 5.11 MIL/uL   Hemoglobin 10.4 (L) 12.0 - 15.0 g/dL   HCT 16.1 (L) 09.6 - 04.5 %   MCV 84.9 80.0 - 100.0 fL   MCH 27.2 26.0 - 34.0 pg   MCHC 32.0 30.0 - 36.0 g/dL   RDW 40.9 81.1 - 91.4 %   Platelets 234 150 - 400 K/uL   nRBC 0.0 0.0 - 0.2 %  Type and screen [facility]   Collection Time: 05/09/23 12:14 AM  Result Value Ref Range   ABO/RH(D) O POS     Antibody Screen NEG    Sample Expiration      05/12/2023,2359 Performed at [facility], [address] [place], [place] [address]     Patient Active Problem List   Diagnosis Date Noted   [redacted] weeks gestation of pregnancy 05/09/2023   Gestational diabetes 02/20/2023   High-risk pregnancy 11/26/2022   AMA (advanced maternal age) multigravida 35+ 11/26/2022   Tachycardia 11/26/2022   Rectocele 11/20/2020   Pelvic relaxation 11/20/2020   History of pulmonary embolus (PE) 02/21/2020   Genetic testing 03/30/2016   Family history of breast cancer    Family history of BRCA1 gene positive     Assessment/Plan:  SWAYZE SCHIED is a 37 y.o. A2Z3086 at [redacted]w[redacted]d here for SOL. Patient is GBS positive and will need antibiotics 4 hours prior to delivery.  #Labor: Continue expectant management. Consider AROM 4 hours after penicillin given. Will repeat CVE at that time as well unless clinically indicated. Plan discussed with patient, who is agreeable with it at this time. #Pain: Per patient request #FWB: Cat 1 #ID:  GBS positive - Penicillin #MOF: Breast and bottle #MOC: Patient is considering post placental ParaGard IUD versus interval tubal ligation.  Is undecided on epidural so if she receives epidural she may want an IUD. #Circ:  No  Matthew Folks Loescher, DO  05/09/2023, 1:16 AM  Fellow Attestation  I saw and evaluated the patient, performing the key elements of the service.I  personally performed or re-performed the history, physical exam, and medical decision making activities of this service and have verified that the service and findings are accurately documented in the resident's note. I developed the management plan that is described in the resident's note, and I agree with the content, with my edits above.    Derrel Nip, MD OB Fellow

## 2023-05-09 NOTE — Discharge Summary (Signed)
Postpartum Discharge Summary  Date of Service updated***     Patient Name: [name] DOB: [birth year] MRN: [id number]  Date of admission: 05/08/2023 Delivery date:05/09/2023 Delivering provider: Celedonio Savage Date of discharge: 05/09/2023  Admitting diagnosis: [redacted] weeks gestation of pregnancy [Z3A.39] Intrauterine pregnancy: [redacted]w[redacted]d     Secondary diagnosis:  Principal Problem:   [redacted] weeks gestation of pregnancy Active Problems:   Genetic testing   History of pulmonary embolus (PE)   High-risk pregnancy   AMA (advanced maternal age) multigravida 37+   Gestational diabetes  Additional problems: ***    Discharge diagnosis: Term Pregnancy Delivered and GDM A1                                              Post partum procedures:{Postpartum procedures:23558} Augmentation: AROM and Pitocin Complications: None  Hospital course: Induction of Labor With Vaginal Delivery   37 y.o. yo [id number] at [redacted]w[redacted]d was admitted to the hospital 05/08/2023 for induction of labor.  Indication for induction: A1 DM.  Patient had an uncomplicated labor course.  Membrane Rupture Time/Date: 6:15 AM,05/09/2023  Delivery Method:Vaginal, Spontaneous Operative Delivery:{Operative Delivery:30121} Episiotomy: None Lacerations:    Details of delivery can be found in separate delivery note.  Patient had a postpartum course complicated by***. Patient is discharged home 05/09/23.  Newborn Data: Birth date:05/09/2023 Birth time:8:10 AM Gender:Female Living status:Living Apgars:9 ,9  Weight:   Magnesium Sulfate received: {Mag received:30440022} BMZ received: {BMZ received:30440023} Rhophylac:{Rhophylac received:30440032} MVH:{QIO:96295284} T-DaP:{Tdap:23962} Flu: {XLK:44010} Transfusion:{Transfusion received:30440034}  Physical exam  Vitals:   05/09/23 0001  BP: 125/76  Pulse: 94  Resp: 17  Temp: 98.2 F (36.8 C)  TempSrc: Oral  SpO2: 99%  Weight: 92.4 kg  Height: 5\' 3"  (1.6 m)   General: {Exam;  general:21111117} Lochia: {Desc; appropriate/inappropriate:30686::"appropriate"} Uterine Fundus: {Desc; firm/soft:30687} Incision: {Exam; incision:21111123} DVT Evaluation: {Exam; UVO:5366440} Labs: Lab Results  Component Value Date   WBC 9.7 05/09/2023   HGB 10.4 (L) 05/09/2023   HCT 32.5 (L) 05/09/2023   MCV 84.9 05/09/2023   PLT 234 05/09/2023      Latest Ref Rng & Units 05/09/2023   12:14 AM  CMP  Glucose 70 - 99 mg/dL 347   BUN 6 - 20 mg/dL 5   Creatinine 4.25 - 9.56 mg/dL 3.87   Sodium 564 - 332 mmol/L 133   Potassium 3.5 - 5.1 mmol/L 3.3   Chloride 98 - 111 mmol/L 103   CO2 22 - 32 mmol/L 19   Calcium 8.9 - 10.3 mg/dL 8.6    Edinburgh Score:     No data to display           After visit meds:  Allergies as of 05/09/2023   No Known Allergies   Med Rec must be completed prior to using this [facility]***        Discharge home in stable condition Infant Feeding: {Baby feeding:23562} Infant Disposition:{CHL IP OB HOME WITH RJJOAC:16606} Discharge instruction: per After Visit Summary and Postpartum booklet. Activity: Advance as tolerated. Pelvic rest for 6 weeks.  Diet: {OB TKZS:01093235} Future Appointments: Future Appointments  Date Time Provider Department Center  07/02/2023  8:00 AM Tobb, Lavona Mound, DO [facility] None   Follow up Visit: Message sent   Please schedule this patient for a In person postpartum visit in 4 weeks with the following provider: Any provider. Additional Postpartum  F/U:2 hour GTT  High risk pregnancy complicated by: GDM and Bleeding disorder on Lovenox Delivery mode:  Vaginal, Spontaneous Anticipated Birth Control:   OP IUD vs interval tubal    05/09/2023 Celedonio Savage, MD

## 2023-05-09 NOTE — Lactation Note (Addendum)
This note was copied from a baby's chart. Lactation Consultation Note  Patient Name: [name] [id number] Date: 05/09/2023 Age:37 hours Reason for consult: Initial assessment;Term P3, term female infant, Per Birth Parent, she feels infant is latching well at the breast and her feeding choice is breastfeeding infant and supplementing infant with formula. Birth Parent recently finished feeding infant as LC entered the room, LC did not observe latch with recent feeding. Per Birth Parent, infant BF for 15 minutes and then was given 23 mls of formula. LC gave hand out with  Breastfeeding Feeding Amounts. Birth Parent knows to continue to latch infant 1st with every feeding to help establish her milk supply, feeding by cues on demand, 8 to 12+ times within 24 hours, skin to skin.LC discussed infant's input and output, infant had 2 voids and one stool since birth. LC discussed importance of maternal rest, diet and hydration. Birth Parent was  made aware of O/P services, breastfeeding support groups, community resources, and our phone # for post-discharge questions.    Maternal Data Has patient been taught Hand Expression?: Yes Does the patient have breastfeeding experience prior to this delivery?: Yes How long did the patient breastfeed?: Per Birth Parent she BF her 56 year old and 84 year old for 3 months each  Feeding Mother's Current Feeding Choice: Breast Milk and Formula  LATCH Score  LC did not observe latch, infant recently breastfeed for 15 minutes and then was supplemented with 23 mls of formula.                   Lactation Tools Discussed/Used    Interventions Interventions: Pace feeding;Education;Support pillows;Breast feeding basics reviewed;LC Services brochure  Discharge Pump: DEBP;Personal  Consult Status Consult Status: Follow-up Date: 05/10/23 Follow-up type: In-patient    [name] 05/09/2023, 4:18 PM

## 2023-05-09 NOTE — Progress Notes (Signed)
Labor Progress Note RAMIKA DERMAN is a 37 y.o. [id number] at 106w0d presented for active labor. S: Patient requesting AROM. Reports contractions increasing in strength.  O:  BP 125/76 (BP Location: Right Arm)   Pulse 94   Temp 98.2 F (36.8 C) (Oral)   Resp 17   Ht 5\' 3"  (1.6 m)   Wt 92.4 kg   LMP 08/09/2022   SpO2 99%   BMI 36.08 kg/m  EFM: 140/moderate variability/Accels present, no decels  CVE: Dilation: 8 Effacement (%): 100 Station: 0 Presentation: Vertex Exam by:: Marsela Kuan DO   A&P: 37 y.o. [id number] [redacted]w[redacted]d presented for active labor. #Labor: Progressing well. Amniotomy performed. Continue expectant management. Consider augmentation with pitocin if no change in CVE. #Pain: Per patient request. #FWB: Cat 1 #GBS positive - PCN #GDM BG have been uncontrolled. Start endo tool. Continue glucose checks q1h.  Matthew Folks Shun Pletz, DO 6:20 AM

## 2023-05-10 ENCOUNTER — Other Ambulatory Visit (HOSPITAL_COMMUNITY): Payer: Self-pay

## 2023-05-10 MED ORDER — ACETAMINOPHEN 325 MG PO TABS: 650.0000 mg | ORAL_TABLET | ORAL | 0 refills | Status: DC | PRN

## 2023-05-10 MED ORDER — ACETAMINOPHEN 325 MG PO TABS
650.0000 mg | ORAL_TABLET | ORAL | 0 refills | Status: AC | PRN
Start: 2023-05-10 — End: ?
  Filled 2023-05-10: qty 100, 9d supply, fill #0

## 2023-05-10 MED ORDER — IBUPROFEN 600 MG PO TABS
600.0000 mg | ORAL_TABLET | Freq: Four times a day (QID) | ORAL | 0 refills | Status: AC
Start: 2023-05-10 — End: ?
  Filled 2023-05-10: qty 30, 8d supply, fill #0

## 2023-05-10 MED ORDER — IBUPROFEN 600 MG PO TABS: 600.0000 mg | ORAL_TABLET | Freq: Four times a day (QID) | ORAL | 0 refills | Status: DC

## 2023-05-10 NOTE — Progress Notes (Signed)
Discharge instructions reviewed with patient and SO. Pt verbalized understanding. Infant placed in car seat per parent. ID bands matched, umbilical clamp and security tag removed. Patient and infant discharged home in stable condition with all personal belongings. Pt requested to be wheel chaired out to car.

## 2023-05-10 NOTE — Lactation Note (Signed)
This note was copied from a baby's chart. Lactation Consultation Note  Patient Name: [name] [id number] Date: 05/10/2023 Age:37 hours  Reason for consult: Follow-up assessment;Term  P3, [redacted]w[redacted]d, 2% weight loss  Mother states baby is latching well but she does not feel like her milk is in yet. By choice, mother is breastfeeding and formula feeding. She reports baby breastfeeds well but he gets "eager". Baby has been spitting up and gassy with bottle feeding, at times. Given mother a white slow flow Nfant nipple to reduce drip frequency from the bottle nipple.  Encouraged mother to breastfeed more often and formula feed after breastfeeding. She reports her milk came in well and she breast fed for 3 months with her other babies.  Discussed the process of milk production, supply and demand and the importance of breast stimulation and milk removal. Instructed mother to breastfeed 8-12 times in 24 hours, skin to skin, breast feed before formula feeding. Pump when baby gets a bottle supplement to establish a milk supply. Mother verbalized understanding. Mother reports she has a breast pump at home.   Feeding Mother's Current Feeding Choice: Breast Milk and Formula Nipple Type: Nfant Standard Flow (white)  Interventions Interventions: Education;Breast feeding basics reviewed  Discharge Discharge Education: Engorgement and breast care;Warning signs for feeding baby Pump: Personal  Consult Status Consult Status: Complete Date: 05/10/23    Omar Person 05/10/2023, 11:52 AM

## 2023-05-13 ENCOUNTER — Encounter: Payer: Managed Care, Other (non HMO) | Admitting: Advanced Practice Midwife

## 2023-06-05 ENCOUNTER — Telehealth (HOSPITAL_COMMUNITY): Payer: Self-pay | Admitting: *Deleted

## 2023-06-05 NOTE — Telephone Encounter (Signed)
Attempted hospital discharge follow-up call. Left message for patient to return RN call with any questions or concerns. Deforest Hoyles, RN, 06/05/23, [phone]

## 2023-06-17 ENCOUNTER — Other Ambulatory Visit: Payer: Managed Care, Other (non HMO)

## 2023-06-17 ENCOUNTER — Ambulatory Visit: Payer: Managed Care, Other (non HMO) | Admitting: Advanced Practice Midwife

## 2023-06-17 VITALS — BP 104/63 | HR 58 | Ht 63.0 in | Wt 183.0 lb

## 2023-06-17 DIAGNOSIS — O0993 Supervision of high risk pregnancy, unspecified, third trimester: Secondary | ICD-10-CM

## 2023-06-17 DIAGNOSIS — Z8632 Personal history of gestational diabetes: Secondary | ICD-10-CM

## 2023-06-17 DIAGNOSIS — Z131 Encounter for screening for diabetes mellitus: Secondary | ICD-10-CM

## 2023-06-17 NOTE — Progress Notes (Signed)
Post Partum Visit Note   Chief Complaint:   Postpartum Care  History of Present Illness:   [name] is a 37 y.o. [phone]  female being seen today for a postpartum visit. She is 6 weeks postpartum following a spontaneous vaginal delivery at 39 gestational weeks. IOL: No, . Anesthesia: epidural.  Laceration: 1st degree.  Complications: none. Inpatient contraception: no.   Pregnancy complicated by A1DM . Tobacco use: no. Substance use disorder: no. Last pap smear:     Component Value Date/Time   DIAGPAP  12/24/2022 1027    - Negative for intraepithelial lesion or malignancy (NILM)   DIAGPAP (A) 02/21/2020 1147    - Atypical squamous cells of undetermined significance (ASC-US)   HPVHIGH Negative 12/24/2022 1027   HPVHIGH Negative 02/21/2020 1147   ADEQPAP  12/24/2022 1027    Satisfactory for evaluation; transformation zone component PRESENT.   ADEQPAP  02/21/2020 1147    Satisfactory for evaluation; transformation zone component PRESENT.     Next pap smear due: 2027 Patient's last menstrual period was 08/09/2022.  Postpartum course has been uncomplicated. Bleeding no bleeding. Bowel function is normal. Bladder function is normal. Urinary incontinence? No, fecal incontinence? No  Feels like "something is prolapsing".Wondering if laceration was healed correctly.   Patient is not sexually active. Last sexual activity: prior to birth.    Upstream - 06/17/23 0907       Pregnancy Intention Screening   Does the patient want to become pregnant in the next year? No    Would the patient like to discuss contraceptive options today? No      Contraception Wrap Up   Current Method Female Sterilization    End Method Female Sterilization    Contraception Counseling Provided No            The pregnancy intention screening data noted above was reviewed. Potential methods of contraception were discussed. The patient elected to proceed with Female Sterilization. Did not sign papers  previously.   Edinburgh Postpartum Depression Screening: Negative  Edinburgh Postnatal Depression Scale - 06/17/23 0905       Edinburgh Postnatal Depression Scale:  In the Past 7 Days   I have been able to laugh and see the funny side of things. 0    I have looked forward with enjoyment to things. 0    I have blamed myself unnecessarily when things went wrong. 1    I have been anxious or worried for no good reason. 0    I have felt scared or panicky for no good reason. 1    Things have been getting on top of me. 0    I have been so unhappy that I have had difficulty sleeping. 0    I have felt sad or miserable. 1    I have been so unhappy that I have been crying. 1    The thought of harming myself has occurred to me. 0    Edinburgh Postnatal Depression Scale Total 4            Baby's course has been uncomplicated. Baby is feeding by breast and bottle: milk supply adequate. Infant has a pediatrician/family doctor? Yes.  Childcare strategy if returning to work/school: yes.  Pt has material needs met for her and baby: Yes.   Review of Systems:   Pertinent items are noted in HPI Denies Abnormal vaginal discharge w/ itching/odor/irritation, headaches, visual changes, shortness of breath, chest pain, abdominal pain, severe nausea/vomiting, or problems with urination or  bowel movements. Pertinent History Reviewed:  Reviewed past medical,surgical, obstetrical and family history.  Reviewed problem list, medications and allergies. OB History  Gravida Para Term Preterm AB Living  5 3 3   2 3   SAB IAB Ectopic Multiple Live Births  2     0 3    # Outcome Date GA Lbr Len/2nd Weight Sex Type Anes PTL Lv  5 Term 05/09/23 [redacted]w[redacted]d 08:00 / 00:10 7 lb 1 oz (3.204 kg) M Vag-Spont None  LIV  4 Term 12/10/07 [redacted]w[redacted]d  7 lb (3.175 kg) M Vag-Spont Local N LIV  3 SAB 2007          2 SAB 2006          1 Term 07/24/04 [redacted]w[redacted]d  7 lb (3.175 kg) F Vag-Spont Local N LIV   Physical Assessment:   Vitals:    06/17/23 0858  BP: 104/63  Pulse: (!) 58  Weight: 183 lb (83 kg)  Height: 5\' 3"  (1.6 m)  Body mass index is 32.42 kg/m.  Objective:  Blood pressure 104/63, pulse (!) 58, height 5\' 3"  (1.6 m), weight 183 lb (83 kg), last menstrual period 08/09/2022, currently breastfeeding.  General:  alert, cooperative, and no distress   Breasts:  negative  Lungs: Normal respiratory effort  Heart:  regular rate and rhythm  Abdomen: soft, non-tender,    Vulva:  normal  Vagina: normal vagina and laceration well healed.  Some descent of bladder w/valsalva  Cervix:  normal  Corpus: Well involuted  Adnexa:  not evaluated  Rectal Exam: Small external hemorrhoids          No results found for this or any previous visit (from the past 24 hour(s)).  Assessment & Plan:  1) Postpartum exam 2) 6 wks s/p spontaneous vaginal delivery 3) breast & bottle feeding 4) Depression screening 5) Contraception counseling:  Plans outpt BTL, papers signed today  Essential components of care per ACOG recommendations:  1.  Mood and well being:  If positive depression screen, discussed and plan developed.  If using tobacco we discussed reduction/cessation and risk of relapse If current substance abuse, we discussed and referral to local resources was offered.   2. Infant care and feeding:  If breastfeeding, discussed returning to work, pumping, breastfeeding-associated pain, guidance regarding return to fertility while lactating if not using another method. If needed, patient was provided with a letter to be allowed to pump q 2-3hrs to support lactation in a private location with access to a refrigerator to store breastmilk.   Recommended that all caregivers be immunized for flu, pertussis and other preventable communicable diseases If pt does not have material needs met for her/baby, referred to local resources for help obtaining these.  3. Sexuality, contraception and birth spacing Provided guidance regarding  sexuality, management of dyspareunia, and resumption of intercourse Discussed avoiding interpregnancy interval <83mths and recommended birth spacing of 18 months  4. Sleep and fatigue Discussed coping options for fatigue and sleep disruption Encouraged family/partner/community support of 4 hrs of uninterrupted sleep to help with mood and fatigue  5. Physical recovery  If pt had a C/S, assessed incisional pain and providing guidance on normal vs prolonged recovery If pt had a laceration, perineal healing and pain reviewed.  If urinary or fecal incontinence, discussed management and referred to PT or uro/gyn if indicated pt has done pelvic floor pt in the past--will try exercises she learned; if not working, has own PT she will contact Patient is safe to  resume physical activity. Discussed attainment of healthy weight.  6.  Chronic disease management Discussed pregnancy complications if any, and their implications for future childbearing and long-term maternal health. Review recommendations for prevention of recurrent pregnancy complications, such as aspirin to reduce risk of preeclampsia not applicable. Pt had GDM: Yes. If yes, 2hr GTT scheduled: yes. Reviewed medications and non-pregnant dosing including consideration of whether pt is breastfeeding using a reliable resource such as LactMed: not applicable Referred for f/u w/ PCP or subspecialist providers as indicated: not applicable  7. Health maintenance Mammogram at 37yo or earlier if indicated Pap smears as indicated  Meds: No orders of the defined types were placed in this encounter.   Follow-up: Return for preop BTL w/MD.   No orders of the defined types were placed in this encounter.      Jacklyn Shell DNP, CNM [facility] [employer], [facility] 06/17/2023 10:47 AM

## 2023-06-18 LAB — GLUCOSE TOLERANCE, 2 HOURS W/ 1HR
Glucose, 1 hour: 138 mg/dL (ref 70–179)
Glucose, 2 hour: 121 mg/dL (ref 70–152)
Glucose, Fasting: 82 mg/dL (ref 70–91)

## 2023-06-29 ENCOUNTER — Ambulatory Visit (INDEPENDENT_AMBULATORY_CARE_PROVIDER_SITE_OTHER): Payer: Managed Care, Other (non HMO) | Admitting: Obstetrics & Gynecology

## 2023-06-29 ENCOUNTER — Encounter: Payer: Self-pay | Admitting: Obstetrics & Gynecology

## 2023-06-29 VITALS — BP 103/64 | HR 67

## 2023-06-29 DIAGNOSIS — Z3009 Encounter for other general counseling and advice on contraception: Secondary | ICD-10-CM | POA: Diagnosis not present

## 2023-06-29 NOTE — Progress Notes (Signed)
Follow up appointment for pre op:   Chief Complaint  Patient presents with   Pre-op Exam    Blood pressure 103/64, pulse 67, currently breastfeeding.    [id number]  Wants permanent sterilization BTL papers signed 06/17/23 Scheduled 07/21/23: RA bilateral salpingectomy  MEDS ordered this encounter: No orders of the defined types were placed in this encounter.   Orders for this encounter: No orders of the defined types were placed in this encounter.   Impression + Management Plan   ICD-10-CM   1. Encounter for consultation for female sterilization  Z30.09    07/21/23 RA BS      Follow Up: Return in about 1 month (around 08/02/2023) for Post Op, with Dr Despina Hidden.     All questions were answered.  Past Medical History:  Diagnosis Date   Family history of BRCA1 gene positive    Family history of breast cancer    Gestational diabetes    History of pulmonary embolus (PE)     Past Surgical History:  Procedure Laterality Date   NO PAST SURGERIES     REMOVAL OF NON VAGINAL CONTRACEPTIVE DEVICE Right 06/07/2014   Procedure: REMOVAL OF DEEP IMPLANON IMPLANT, ULTRASOUND GUIDED, RIGHT UPPER ARM;  Surgeon: Tilda Burrow, MD;  Location: [facility];  Service: Gynecology;  Laterality: Right;    OB History     Gravida  5   Para  3   Term  3   Preterm      AB  2   Living  3      SAB  2   IAB      Ectopic      Multiple  0   Live Births  3           No Known Allergies  Social History   Socioeconomic History   Marital status: Married    Spouse name: Not on file   Number of children: Not on file   Years of education: Not on file   Highest education level: Not on file  Occupational History   Not on file  Tobacco Use   Smoking status: Never   Smokeless tobacco: Never  Vaping Use   Vaping status: Never Used  Substance and Sexual Activity   Alcohol use: No   Drug use: No   Sexual activity: Yes    Birth control/protection: None  Other Topics Concern    Not on file  Social History Narrative   Not on file   Social Determinants of Health   Financial Resource Strain: Medium Risk (11/26/2022)   Overall Financial Resource Strain (CARDIA)    Difficulty of Paying Living Expenses: Somewhat hard  Food Insecurity: No Food Insecurity (05/09/2023)   Hunger Vital Sign    Worried About Running Out of Food in the Last Year: Never true    Ran Out of Food in the Last Year: Never true  Transportation Needs: No Transportation Needs (05/09/2023)   PRAPARE - Administrator, Civil Service (Medical): No    Lack of Transportation (Non-Medical): No  Physical Activity: Sufficiently Active (11/26/2022)   Exercise Vital Sign    Days of Exercise per Week: 3 days    Minutes of Exercise per Session: 60 min  Stress: Stress Concern Present (11/26/2022)   [employer] of Occupational Health - Occupational Stress Questionnaire    Feeling of Stress : To some extent  Social Connections: Moderately Integrated (11/26/2022)   Social Connection and Isolation Panel [NHANES]  Frequency of Communication with Friends and Family: Twice a week    Frequency of Social Gatherings with Friends and Family: Three times a week    Attends Religious Services: 1 to 4 times per year    Active Member of Clubs or Organizations: No    Attends Banker Meetings: Never    Marital Status: Married    Family History  Problem Relation Age of Onset   Diabetes Mother    Hypertension Mother    Breast cancer Mother 4   Breast cancer Sister    Cancer Sister        breast   Breast cancer Maternal Aunt 60   Stomach cancer Maternal Aunt    Stomach cancer Maternal Grandfather

## 2023-06-30 ENCOUNTER — Encounter: Payer: Self-pay | Admitting: Obstetrics & Gynecology

## 2023-07-02 ENCOUNTER — Telehealth: Payer: Self-pay

## 2023-07-02 ENCOUNTER — Encounter: Payer: Self-pay | Admitting: Cardiology

## 2023-07-02 ENCOUNTER — Ambulatory Visit: Payer: Managed Care, Other (non HMO) | Attending: Cardiology | Admitting: Cardiology

## 2023-07-02 VITALS — Ht 63.0 in | Wt 177.0 lb

## 2023-07-02 DIAGNOSIS — R002 Palpitations: Secondary | ICD-10-CM | POA: Diagnosis not present

## 2023-07-02 NOTE — Telephone Encounter (Signed)
  Patient Consent for Virtual Visit        [name] has provided verbal consent on 07/02/2023 for a virtual visit (video or telephone).   CONSENT FOR VIRTUAL VISIT FOR:  [name]  By participating in this virtual visit I agree to the following:  I hereby voluntarily request, consent and authorize [facility] and its employed or contracted physicians, physician assistants, nurse practitioners or other licensed health care professionals (the Practitioner), to provide me with telemedicine health care services (the "Services") as deemed necessary by the treating Practitioner. I acknowledge and consent to receive the Services by the Practitioner via telemedicine. I understand that the telemedicine visit will involve communicating with the Practitioner through live audiovisual communication technology and the disclosure of certain medical information by electronic transmission. I acknowledge that I have been given the opportunity to request an in-person assessment or other available alternative prior to the telemedicine visit and am voluntarily participating in the telemedicine visit.  I understand that I have the right to withhold or withdraw my consent to the use of telemedicine in the course of my care at any time, without affecting my right to future care or treatment, and that the Practitioner or I may terminate the telemedicine visit at any time. I understand that I have the right to inspect all information obtained and/or recorded in the course of the telemedicine visit and may receive copies of available information for a reasonable fee.  I understand that some of the potential risks of receiving the Services via telemedicine include:  Delay or interruption in medical evaluation due to technological equipment failure or disruption; Information transmitted may not be sufficient (e.g. poor resolution of images) to allow for appropriate medical decision making by the Practitioner;  and/or  In rare instances, security protocols could fail, causing a breach of personal health information.  Furthermore, I acknowledge that it is my responsibility to provide information about my medical history, conditions and care that is complete and accurate to the best of my ability. I acknowledge that Practitioner's advice, recommendations, and/or decision may be based on factors not within their control, such as incomplete or inaccurate data provided by me or distortions of diagnostic images or specimens that may result from electronic transmissions. I understand that the practice of medicine is not an exact science and that Practitioner makes no warranties or guarantees regarding treatment outcomes. I acknowledge that a copy of this consent can be made available to me via my patient portal [facility] MyChart), or I can request a printed copy by calling the office of [facility].    I understand that my insurance will be billed for this visit.   I have read or had this consent read to me. I understand the contents of this consent, which adequately explains the benefits and risks of the Services being provided via telemedicine.  I have been provided ample opportunity to ask questions regarding this consent and the Services and have had my questions answered to my satisfaction. I give my informed consent for the services to be provided through the use of telemedicine in my medical care

## 2023-07-02 NOTE — Patient Instructions (Signed)
Medication Instructions:  Your physician recommends that you continue on your current medications as directed. Please refer to the Current Medication list given to you today.  *If you need a refill on your cardiac medications before your next appointment, please call your pharmacy*    Follow-Up: At [facility], you and your health needs are our priority.  As part of our continuing mission to provide you with exceptional heart care, we have created designated Provider Care Teams.  These Care Teams include your primary Cardiologist (physician) and Advanced Practice Providers (APPs -  Physician Assistants and Nurse Practitioners) who all work together to provide you with the care you need, when you need it.  We recommend signing up for the patient portal called "MyChart".  Sign up information is provided on this After Visit Summary.  MyChart is used to connect with patients for Virtual Visits (Telemedicine).  Patients are able to view lab/test results, encounter notes, upcoming appointments, etc.  Non-urgent messages can be sent to your provider as well.   To learn more about what you can do with MyChart, go to [email or web address].    Your next appointment:   We will see you on an as needed basis.  Provider:   Thomasene Ripple, DO     Other Instructions Congratulations!! Please let us know if you need [place] in the future. Take Care!

## 2023-07-02 NOTE — Progress Notes (Addendum)
Cardio-Obstetrics Clinic  Follow Up Note   Date:  07/02/2023   ID:  [name], DOB Jan 13, 1986, MRN [id number]  PCP:  Assunta Found, MD    [facility]Care Providers Cardiologist:  Thomasene Ripple, DO  Electrophysiologist:  None        Referring MD: Assunta Found, MD   Chief Complaint: "I am doing well"  Virtual Visit via Video  Note . I connected with the patient today by a   video enabled telemedicine application and verified that I am speaking with the correct person using two identifiers.  I am in the clinic. The patient is at home  History of Present Illness:    [name] is a 37 y.o. female [[id number]] who returns for follow up postpartum cardiovascular follow-up.  History includes pulmonary embolism was on anticoagulation during the pregnancy.  During the time her pregnancy she has been some palpitation placed a monitor on the patient was normal.  She has delivered.  Today she offers no complaints at this time.  She is doing well from a cardiovascular standpoint.   Prior CV Studies Reviewed: The following studies were reviewed today:   Past Medical History:  Diagnosis Date   Family history of BRCA1 gene positive    Family history of breast cancer    Gestational diabetes    History of pulmonary embolus (PE)     Past Surgical History:  Procedure Laterality Date   NO PAST SURGERIES     REMOVAL OF NON VAGINAL CONTRACEPTIVE DEVICE Right 06/07/2014   Procedure: REMOVAL OF DEEP IMPLANON IMPLANT, ULTRASOUND GUIDED, RIGHT UPPER ARM;  Surgeon: Tilda Burrow, MD;  Location: [facility];  Service: Gynecology;  Laterality: Right;      OB History     Gravida  5   Para  3   Term  3   Preterm      AB  2   Living  3      SAB  2   IAB      Ectopic      Multiple  0   Live Births  3               Current Medications: Current Meds  Medication Sig   Prenatal Vit-Fe Fumarate-FA (PRENATAL VITAMIN PO) Take by mouth.     Allergies:   Patient has  no known allergies.   Social History   Socioeconomic History   Marital status: Married    Spouse name: Not on file   Number of children: Not on file   Years of education: Not on file   Highest education level: Not on file  Occupational History   Not on file  Tobacco Use   Smoking status: Never   Smokeless tobacco: Never  Vaping Use   Vaping status: Never Used  Substance and Sexual Activity   Alcohol use: No   Drug use: No   Sexual activity: Yes    Birth control/protection: None  Other Topics Concern   Not on file  Social History Narrative   Not on file   Social Determinants of Health   Financial Resource Strain: Medium Risk (11/26/2022)   Overall Financial Resource Strain (CARDIA)    Difficulty of Paying Living Expenses: Somewhat hard  Food Insecurity: No Food Insecurity (05/09/2023)   Hunger Vital Sign    Worried About Running Out of Food in the Last Year: Never true    Ran Out of Food in the Last Year: Never true  Transportation  Needs: No Transportation Needs (05/09/2023)   PRAPARE - Administrator, Civil Service (Medical): No    Lack of Transportation (Non-Medical): No  Physical Activity: Sufficiently Active (11/26/2022)   Exercise Vital Sign    Days of Exercise per Week: 3 days    Minutes of Exercise per Session: 60 min  Stress: Stress Concern Present (11/26/2022)   [employer] of Occupational Health - Occupational Stress Questionnaire    Feeling of Stress : To some extent  Social Connections: Moderately Integrated (11/26/2022)   Social Connection and Isolation Panel [NHANES]    Frequency of Communication with Friends and Family: Twice a week    Frequency of Social Gatherings with Friends and Family: Three times a week    Attends Religious Services: 1 to 4 times per year    Active Member of Clubs or Organizations: No    Attends Banker Meetings: Never    Marital Status: Married      Family History  Problem Relation Age of Onset    Diabetes Mother    Hypertension Mother    Breast cancer Mother 26   Breast cancer Sister    Cancer Sister        breast   Breast cancer Maternal Aunt 60   Stomach cancer Maternal Aunt    Stomach cancer Maternal Grandfather       ROS:   Please see the history of present illness.     All other systems reviewed and are negative.   Labs/EKG Reviewed:    EKG:   Was not done today  Recent Labs: 05/09/2023: BUN 5; Creatinine, Ser 0.73; Hemoglobin 10.4; Platelets 234; Potassium 3.3; Sodium 133   Recent Lipid Panel No results found for: "CHOL", "TRIG", "HDL", "CHOLHDL", "LDLCALC", "LDLDIRECT"  Physical Exam:    VS:  Ht 5\' 3"  (1.6 m)   Wt 177 lb (80.3 kg)   Breastfeeding Yes   BMI 31.35 kg/m     Wt Readings from Last 3 Encounters:  07/02/23 177 lb (80.3 kg)  06/17/23 183 lb (83 kg)  05/09/23 203 lb 11.2 oz (92.4 kg)     GEN:  Well nourished, well developed in no acute distress HEENT: Normal NECK: No JVD; No carotid bruits LYMPHATICS: No lymphadenopathy CARDIAC: RRR, no murmurs, rubs, gallops RESPIRATORY:  Clear to auscultation without rales, wheezing or rhonchi  ABDOMEN: Soft, non-tender, non-distended MUSCULOSKELETAL:  No edema; No deformity  SKIN: Warm and dry NEUROLOGIC:  Alert and oriented x 3 PSYCHIATRIC:  Normal affect    Risk Assessment/Risk Calculators:                 ASSESSMENT & PLAN:    Palpitations  She is doing well from a cardiovascular standpoint. PHQ-9 depression screening was negative.  No evidence of postpartum depression.  Total time spent 15 minutes  Follow-up as needed. There are no Patient Instructions on file for this visit.   Dispo:  No follow-ups on file.   Medication Adjustments/Labs and Tests Ordered: Current medicines are reviewed at length with the patient today.  Concerns regarding medicines are outlined above.  Tests Ordered: No orders of the defined types were placed in this encounter.  Medication Changes: No  orders of the defined types were placed in this encounter.

## 2023-07-02 NOTE — Addendum Note (Signed)
Addended by: Bernita Buffy on: 07/02/2023 09:24 AM   Modules accepted: Orders

## 2023-07-19 ENCOUNTER — Other Ambulatory Visit (HOSPITAL_COMMUNITY): Payer: Managed Care, Other (non HMO)

## 2023-07-21 ENCOUNTER — Ambulatory Visit (HOSPITAL_COMMUNITY): Admit: 2023-07-21 | Payer: Managed Care, Other (non HMO) | Admitting: Obstetrics & Gynecology

## 2023-07-21 SURGERY — XI ROBOTIC ASSISTED SALPINGECTOMY
Anesthesia: General | Laterality: Bilateral

## 2023-08-02 ENCOUNTER — Encounter: Payer: Managed Care, Other (non HMO) | Admitting: Obstetrics & Gynecology

## 2024-02-07 ENCOUNTER — Ambulatory Visit

## 2024-02-10 ENCOUNTER — Ambulatory Visit

## 2024-02-10 ENCOUNTER — Other Ambulatory Visit (HOSPITAL_COMMUNITY)
Admission: RE | Admit: 2024-02-10 | Discharge: 2024-02-10 | Disposition: A | Discharge status: Home / Self Care | Source: Ambulatory Visit | Attending: Obstetrics & Gynecology | Admitting: Obstetrics & Gynecology

## 2024-02-10 DIAGNOSIS — B9689 Other specified bacterial agents as the cause of diseases classified elsewhere: Secondary | ICD-10-CM | POA: Insufficient documentation

## 2024-02-10 DIAGNOSIS — N760 Acute vaginitis: Secondary | ICD-10-CM | POA: Diagnosis not present

## 2024-02-10 DIAGNOSIS — Z113 Encounter for screening for infections with a predominantly sexual mode of transmission: Secondary | ICD-10-CM | POA: Insufficient documentation

## 2024-02-10 NOTE — Progress Notes (Signed)
   NURSE VISIT- VAGINITIS/STD/POC  SUBJECTIVE:  [name] is a 38 y.o. [id number] GYN patientfemale here for a vaginal swab for STD screen.  She reports the following symptoms: none for no days. Denies abnormal vaginal bleeding, significant pelvic pain, fever, or UTI symptoms.  OBJECTIVE:  There were no vitals taken for this visit.  Appears well, in no apparent distress  ASSESSMENT: Vaginal swab for STD screen  PLAN: Self-collected vaginal probe for Gonorrhea, Chlamydia, Trichomonas, Bacterial Vaginosis, Yeast sent to lab Treatment: to be determined once results are received Follow-up as needed if symptoms persist/worsen, or new symptoms develop  Kerrie Peek  02/10/2024 2:36 PM

## 2024-02-14 ENCOUNTER — Ambulatory Visit: Payer: Self-pay | Admitting: Adult Health

## 2024-02-14 LAB — CERVICOVAGINAL ANCILLARY ONLY
Bacterial Vaginitis (gardnerella): POSITIVE — AB
Candida Glabrata: NEGATIVE
Candida Vaginitis: NEGATIVE
Chlamydia: NEGATIVE
Comment: NEGATIVE
Comment: NEGATIVE
Comment: NEGATIVE
Comment: NEGATIVE
Comment: NEGATIVE
Comment: NORMAL
Neisseria Gonorrhea: NEGATIVE
Trichomonas: NEGATIVE

## 2024-02-14 MED ORDER — METRONIDAZOLE 0.75 % VA GEL: 1.0000 | Freq: Every day | VAGINAL | 0 refills | Status: AC

## 2024-04-18 DIAGNOSIS — B351 Tinea unguium: Secondary | ICD-10-CM | POA: Diagnosis not present

## 2024-05-03 ENCOUNTER — Ambulatory Visit
Admission: RE | Admit: 2024-05-03 | Discharge: 2024-05-03 | Disposition: A | Discharge status: Home / Self Care | Source: Ambulatory Visit | Attending: Nurse Practitioner | Admitting: Nurse Practitioner

## 2024-05-03 VITALS — BP 112/63 | HR 64 | Temp 99.6°F | Resp 18

## 2024-05-03 DIAGNOSIS — L732 Hidradenitis suppurativa: Secondary | ICD-10-CM | POA: Diagnosis not present

## 2024-05-03 MED ORDER — MUPIROCIN 2 % EX OINT: 1.0000 | TOPICAL_OINTMENT | Freq: Two times a day (BID) | CUTANEOUS | 0 refills | Status: AC

## 2024-05-03 NOTE — ED Provider Notes (Signed)
 [facility]    CSN: [id number] Arrival date & time: 05/03/24  1700      History   Chief Complaint Chief Complaint  Patient presents with   Wound Check    I had an HS bump in my groin area and it popped a while back and the wound won't close, it's been open for at least a month with no closure some liquid has came out last time it bled was maybe a week or 2 ago. - Entered by patient    HPI [name] is a 38 y.o. female.   The history is provided by the patient.   Patient presents for complaints of an open area to her left groin has been present for the past 2 to 3 weeks.  Patient states the area has been draining.  She states she has a history of hidradenitis.  She denies fever, chills, foul-smelling drainage from the site, increased swelling, or redness around the site.  States she has been cleaning the area with warm soap and water only.  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   History of pulmonary embolus (PE) 02/21/2020   Genetic testing 03/30/2016   Family history of breast cancer    Family history of BRCA1 gene positive     Past Surgical History:  Procedure Laterality Date   NO PAST SURGERIES     REMOVAL OF NON VAGINAL CONTRACEPTIVE DEVICE Right 06/07/2014   Procedure: REMOVAL OF DEEP IMPLANON IMPLANT, ULTRASOUND GUIDED, RIGHT UPPER ARM;  Surgeon: Norleen Edsel GAILS, MD;  Location: [facility];  Service: Gynecology;  Laterality: Right;    OB History     Gravida  5   Para  3   Term  3   Preterm      AB  2   Living  3      SAB  2   IAB      Ectopic      Multiple  0    Live Births  3            Home Medications    Prior to Admission medications   Medication Sig Start Date End Date Taking? Authorizing Provider  acetaminophen  (TYLENOL ) 325 MG tablet Take 2 tablets (650 mg total) by mouth every 4 (four) hours as needed (for pain scale < 4). Patient not taking: Reported on 06/17/2023 05/10/23   Cresenzo, John V, MD  ibuprofen  (ADVIL ) 600 MG tablet Take 1 tablet (600 mg total) by mouth every 6 (six) hours. Patient not taking: Reported on 06/17/2023 05/10/23   Cresenzo, John V, MD  metroNIDAZOLE  (METROGEL ) 0.75 % vaginal gel Place 1 Applicatorful vaginally at bedtime. 02/14/24   Signa Delon LABOR, NP  Prenatal Vit-Fe Fumarate-FA (PRENATAL VITAMIN PO) Take by mouth.    [provider]    Family History Family History  Problem Relation Age of Onset   Diabetes Mother    Hypertension Mother    Breast cancer Mother 38   Breast cancer Sister    Cancer Sister        breast  Breast cancer Maternal Aunt 60   Stomach cancer Maternal Aunt    Stomach cancer Maternal Grandfather     Social History Social History   Tobacco Use   Smoking status: Never   Smokeless tobacco: Never  Vaping Use   Vaping status: Never Used  Substance Use Topics   Alcohol use: No   Drug use: No     Allergies   Patient has no known allergies.   Review of Systems Review of Systems Per HPI  Physical Exam Triage Vital Signs ED Triage Vitals  Encounter Vitals Group     BP 05/03/24 1721 112/63     Girls Systolic BP Percentile --      Girls Diastolic BP Percentile --      Boys Systolic BP Percentile --      Boys Diastolic BP Percentile --      Pulse Rate 05/03/24 1721 64     Resp 05/03/24 1721 18     Temp 05/03/24 1721 99.6 F (37.6 C)     Temp Source 05/03/24 1721 Oral     SpO2 05/03/24 1721 96 %     Weight --      Height --      Head Circumference --      Peak Flow --      Pain Score 05/03/24 1723 3     Pain Loc --      Pain Education --       Exclude from Growth Chart --    No data found.  Updated Vital Signs BP 112/63 (BP Location: Right Arm)   Pulse 64   Temp 99.6 F (37.6 C) (Oral)   Resp 18   LMP 04/04/2024   SpO2 96%   Breastfeeding Yes   Visual Acuity Right Eye Distance:   Left Eye Distance:   Bilateral Distance:    Right Eye Near:   Left Eye Near:    Bilateral Near:     Physical Exam Vitals and nursing note reviewed.  Constitutional:      General: She is not in acute distress.    Appearance: Normal appearance.  HENT:     Head: Normocephalic.     Mouth/Throat:     Mouth: Mucous membranes are moist.  Eyes:     Extraocular Movements: Extraocular movements intact.     Pupils: Pupils are equal, round, and reactive to light.  Cardiovascular:     Rate and Rhythm: Normal rate and regular rhythm.     Pulses: Normal pulses.     Heart sounds: Normal heart sounds.  Pulmonary:     Effort: Pulmonary effort is normal.     Breath sounds: Normal breath sounds.  Abdominal:     General: Bowel sounds are normal.     Palpations: Abdomen is soft.     Tenderness: There is no abdominal tenderness.  Musculoskeletal:     Cervical back: Normal range of motion.  Skin:    General: Skin is warm and dry.      Neurological:     General: No focal deficit present.     Mental Status: She is alert and oriented to person, place, and time.  Psychiatric:        Mood and Affect: Mood normal.        Behavior: Behavior normal.      UC Treatments / Results  Labs (all labs ordered are listed, but only abnormal results are displayed) Labs Reviewed - No data to display  EKG  Radiology No results found.  Procedures Procedures (including critical care time)  Medications Ordered in UC Medications - No data to display  Initial Impression / Assessment and Plan / UC Course  I have reviewed the triage vital signs and the nursing notes.  Pertinent labs & imaging results that were available during my care of the patient  were reviewed by me and considered in my medical decision making (see chart for details).  Treat for low skin infection with mupirocin  2% ointment.  Supportive care recommendations were provided and discussed with the patient to include over-the-counter analgesics, and keeping the area clean and dry.  Discussed indications with patient regarding follow-up.  Patient was in agreement with this plan of care and verbalizes understanding.  All questions were answered.  Patient stable for discharge.   Final Clinical Impressions(s) / UC Diagnoses   Final diagnoses:  None   Discharge Instructions   None    ED Prescriptions   None    PDMP not reviewed this encounter.   Gilmer Etta PARAS, NP 05/03/24 [phone]

## 2024-05-03 NOTE — ED Triage Notes (Signed)
 Pt reports she has an open area on her groin on the left side that is draining possibly green and some blood x 2 months Keeps a bandaid over the site    Hx of HS

## 2024-05-03 NOTE — Discharge Instructions (Signed)
 Apply medication as prescribed. Keep the area clean and dry.  Recommend cleansing the area daily with warm water and then antibacterial soap such as the Dial gold bar soap. Monitor for signs of worsening.  Follow-up if you experience increased redness, swelling, foul-smelling drainage from the site, or if you develop new symptoms of fever or chills. Follow-up as needed.

## 2024-08-14 ENCOUNTER — Ambulatory Visit: Payer: Self-pay | Admitting: Family Medicine

## 2024-08-14 ENCOUNTER — Encounter: Payer: Self-pay | Admitting: Family Medicine

## 2024-08-14 VITALS — BP 113/65 | HR 85 | Ht 63.0 in | Wt 182.0 lb

## 2024-08-14 DIAGNOSIS — E782 Mixed hyperlipidemia: Secondary | ICD-10-CM

## 2024-08-14 DIAGNOSIS — E038 Other specified hypothyroidism: Secondary | ICD-10-CM | POA: Diagnosis not present

## 2024-08-14 DIAGNOSIS — E559 Vitamin D deficiency, unspecified: Secondary | ICD-10-CM

## 2024-08-14 DIAGNOSIS — N816 Rectocele: Secondary | ICD-10-CM | POA: Diagnosis not present

## 2024-08-14 DIAGNOSIS — R7301 Impaired fasting glucose: Secondary | ICD-10-CM

## 2024-08-14 NOTE — Progress Notes (Signed)
 New Patient Office Visit  Subjective:  Patient ID: [name], female    DOB: 1985/10/17  Age: 38 y.o. MRN: [id number]  CC:  Chief Complaint  Patient presents with   Establish Care    HPI [name] is a 38 y.o. female with past medical history of Pelvic relaxation presents for establishing care.  For the details of today's visit, please refer to the assessment and plan.     Past Medical History:  Diagnosis Date   Family history of BRCA1 gene positive    Family history of breast cancer    Gestational diabetes    History of pulmonary embolus (PE)     Past Surgical History:  Procedure Laterality Date   NO PAST SURGERIES     REMOVAL OF NON VAGINAL CONTRACEPTIVE DEVICE Right 06/07/2014   Procedure: REMOVAL OF DEEP IMPLANON IMPLANT, ULTRASOUND GUIDED, RIGHT UPPER ARM;  Surgeon: Norleen Edsel GAILS, MD;  Location: [facility];  Service: Gynecology;  Laterality: Right;    Family History  Problem Relation Age of Onset   Diabetes Mother    Hypertension Mother    Breast cancer Mother 16   Breast cancer Sister    Cancer Sister        breast   Breast cancer Maternal Aunt 60   Stomach cancer Maternal Aunt    Stomach cancer Maternal Grandfather     Social History   Socioeconomic History   Marital status: Married    Spouse name: Not on file   Number of children: Not on file   Years of education: Not on file   Highest education level: Not on file  Occupational History   Not on file  Tobacco Use   Smoking status: Never   Smokeless tobacco: Never  Vaping Use   Vaping status: Never Used  Substance and Sexual Activity   Alcohol use: No   Drug use: No   Sexual activity: Yes    Birth control/protection: None  Other Topics Concern   Not on file  Social History Narrative   Not on file   Social Drivers of Health   Financial Resource Strain: Medium Risk (11/26/2022)   Overall Financial Resource Strain (CARDIA)    Difficulty of Paying Living Expenses: Somewhat hard  Food  Insecurity: No Food Insecurity (05/09/2023)   Hunger Vital Sign    Worried About Running Out of Food in the Last Year: Never true    Ran Out of Food in the Last Year: Never true  Transportation Needs: No Transportation Needs (05/09/2023)   PRAPARE - Administrator, Civil Service (Medical): No    Lack of Transportation (Non-Medical): No  Physical Activity: Sufficiently Active (11/26/2022)   Exercise Vital Sign    Days of Exercise per Week: 3 days    Minutes of Exercise per Session: 60 min  Stress: Stress Concern Present (11/26/2022)   [employer] of Occupational Health - Occupational Stress Questionnaire    Feeling of Stress : To some extent  Social Connections: Moderately Integrated (11/26/2022)   Social Connection and Isolation Panel    Frequency of Communication with Friends and Family: Twice a week    Frequency of Social Gatherings with Friends and Family: Three times a week    Attends Religious Services: 1 to 4 times per year    Active Member of Clubs or Organizations: No    Attends Banker Meetings: Never    Marital Status: Married  Catering Manager Violence: Not At Risk (  05/09/2023)   Humiliation, Afraid, Rape, and Kick questionnaire    Fear of Current or Ex-Partner: No    Emotionally Abused: No    Physically Abused: No    Sexually Abused: No    ROS Review of Systems  Constitutional:  Negative for chills and fever.  Eyes:  Negative for visual disturbance.  Respiratory:  Negative for chest tightness and shortness of breath.   Neurological:  Negative for dizziness and headaches.    Objective:   Today's Vitals: BP 113/65   Pulse 85   Ht 5' 3 (1.6 m)   Wt 182 lb (82.6 kg)   SpO2 97%   BMI 32.24 kg/m   Physical Exam HENT:     Head: Normocephalic.     Mouth/Throat:     Mouth: Mucous membranes are moist.  Cardiovascular:     Rate and Rhythm: Normal rate.     Heart sounds: Normal heart sounds.  Pulmonary:     Effort: Pulmonary effort is  normal.     Breath sounds: Normal breath sounds.  Neurological:     Mental Status: She is alert.      Assessment & Plan:   Rectocele Assessment & Plan: None reported today   IFG (impaired fasting glucose) -     Hemoglobin A1c  Vitamin D deficiency -     VITAMIN D 25 Hydroxy (Vit-D Deficiency, Fractures)  TSH (thyroid -stimulating hormone deficiency) -     TSH + free T4  Mixed hyperlipidemia -     Lipid panel -     CMP14+EGFR -     CBC with Differential/Platelet   Note: This chart has been completed using Engineer, Civil (consulting) software, and while attempts have been made to ensure accuracy, certain words and phrases may not be transcribed as intended.    Follow-up: Return in about 4 months (around 12/12/2024).   Alizandra Loh  Z Bacchus, FNP

## 2024-08-14 NOTE — Patient Instructions (Signed)
 I appreciate the opportunity to provide care to you today!    Follow up:  4 months  Fasting Labs: please stop by the lab during the week to get your blood drawn (CBC, CMP, TSH, Lipid profile, HgA1c, Vit D)  For a Healthier YOU, I Recommend: Reducing your intake of sugar, sodium, carbohydrates, and saturated fats. Increasing your fiber intake by incorporating more whole grains, fruits, and vegetables into your meals. Setting healthy goals with a focus on lowering your consumption of carbs, sugar, and unhealthy fats. Adding variety to your diet by including a wide range of fruits and vegetables. Cutting back on soda and limiting processed foods as much as possible. Staying active: In addition to taking your weight loss medication, aim for at least 150 minutes of moderate-intensity physical activity each week for optimal results.    Please follow up if your symptoms worsen or fail to improve.    Please continue to a heart-healthy diet and increase your physical activities. Try to exercise for at least five days a week.    It was a pleasure to see you and I look forward to continuing to work together on your health and well-being. Please do not hesitate to call the office if you need care or have questions about your care.  In case of emergency, please visit the Emergency Department for urgent care, or contact our clinic at [phone] to schedule an appointment. We're here to help you!   Have a wonderful day and week. With Gratitude, Meade JENEANE Gerlach MSN, FNP-BC, PMHNP-BC

## 2024-08-14 NOTE — Assessment & Plan Note (Signed)
None reported today.

## 2024-09-14 LAB — CBC WITH DIFFERENTIAL/PLATELET
Basophils Absolute: 0 x10E3/uL (ref 0.0–0.2)
Basos: 1 %
EOS (ABSOLUTE): 0.1 x10E3/uL (ref 0.0–0.4)
Eos: 1 %
Hematocrit: 39.9 % (ref 34.0–46.6)
Hemoglobin: 12.8 g/dL (ref 11.1–15.9)
Immature Grans (Abs): 0 x10E3/uL (ref 0.0–0.1)
Immature Granulocytes: 0 %
Lymphocytes Absolute: 3 x10E3/uL (ref 0.7–3.1)
Lymphs: 47 %
MCH: 30 pg (ref 26.6–33.0)
MCHC: 32.1 g/dL (ref 31.5–35.7)
MCV: 94 fL (ref 79–97)
Monocytes Absolute: 0.5 x10E3/uL (ref 0.1–0.9)
Monocytes: 8 %
Neutrophils Absolute: 2.8 x10E3/uL (ref 1.4–7.0)
Neutrophils: 43 %
Platelets: 315 x10E3/uL (ref 150–450)
RBC: 4.26 x10E6/uL (ref 3.77–5.28)
RDW: 12.1 % (ref 11.7–15.4)
WBC: 6.4 x10E3/uL (ref 3.4–10.8)

## 2024-09-14 LAB — VITAMIN D 25 HYDROXY (VIT D DEFICIENCY, FRACTURES): Vit D, 25-Hydroxy: 21.3 ng/mL — ABNORMAL LOW (ref 30.0–100.0)

## 2024-09-14 LAB — LIPID PANEL
Chol/HDL Ratio: 3.4 ratio (ref 0.0–4.4)
Cholesterol, Total: 164 mg/dL (ref 100–199)
HDL: 48 mg/dL (ref >39)
LDL Chol Calc (NIH): 105 mg/dL — ABNORMAL HIGH (ref 0–99)
Triglycerides: 54 mg/dL (ref 0–149)
VLDL Cholesterol Cal: 11 mg/dL (ref 5–40)

## 2024-09-14 LAB — CMP14+EGFR
ALT: 14 IU/L (ref 0–32)
AST: 16 IU/L (ref 0–40)
Albumin: 4.3 g/dL (ref 3.9–4.9)
Alkaline Phosphatase: 85 IU/L (ref 41–116)
BUN/Creatinine Ratio: 21 (ref 9–23)
BUN: 14 mg/dL (ref 6–20)
Bilirubin Total: 0.6 mg/dL (ref 0.0–1.2)
CO2: 23 mmol/L (ref 20–29)
Calcium: 9.1 mg/dL (ref 8.7–10.2)
Chloride: 103 mmol/L (ref 96–106)
Creatinine, Ser: 0.67 mg/dL (ref 0.57–1.00)
Globulin, Total: 2.8 g/dL (ref 1.5–4.5)
Glucose: 92 mg/dL (ref 70–99)
Potassium: 4.1 mmol/L (ref 3.5–5.2)
Sodium: 140 mmol/L (ref 134–144)
Total Protein: 7.1 g/dL (ref 6.0–8.5)
eGFR: 115 mL/min/1.73 (ref >59)

## 2024-09-14 LAB — TSH+FREE T4
Free T4: 1 ng/dL (ref 0.82–1.77)
TSH: 1.37 u[IU]/mL (ref 0.450–4.500)

## 2024-09-14 LAB — HEMOGLOBIN A1C
Est. average glucose Bld gHb Est-mCnc: 111 mg/dL
Hgb A1c MFr Bld: 5.5 % (ref 4.8–5.6)

## 2024-09-23 ENCOUNTER — Ambulatory Visit: Payer: Self-pay | Admitting: Family Medicine

## 2024-09-23 DIAGNOSIS — E559 Vitamin D deficiency, unspecified: Secondary | ICD-10-CM

## 2024-09-23 MED ORDER — VITAMIN D (ERGOCALCIFEROL) 1.25 MG (50000 UNIT) PO CAPS: 50000.0000 [IU] | ORAL_CAPSULE | ORAL | 1 refills | Status: AC

## 2024-12-12 ENCOUNTER — Ambulatory Visit: Admitting: Family Medicine
